# Patient Record
Sex: Female | Born: 1970
Health system: Southern US, Community
[De-identification: ages and names within clinical notes are randomized; demographics above are authoritative.]

## PROBLEM LIST (undated history)

## (undated) DIAGNOSIS — M170 Bilateral primary osteoarthritis of knee: Secondary | ICD-10-CM

## (undated) DIAGNOSIS — E781 Pure hyperglyceridemia: Secondary | ICD-10-CM

## (undated) DIAGNOSIS — R87619 Unspecified abnormal cytological findings in specimens from cervix uteri: Secondary | ICD-10-CM

## (undated) HISTORY — DX: Unspecified abnormal cytological findings in specimens from cervix uteri: R87.619

## (undated) HISTORY — PX: BREAST CYST ASPIRATION: SHX578

## (undated) HISTORY — DX: Bilateral primary osteoarthritis of knee: M17.0

## (undated) HISTORY — DX: Pure hyperglyceridemia: E78.1

---

## 2001-09-08 ENCOUNTER — Encounter: Payer: Self-pay | Admitting: Internal Medicine

## 2001-09-08 ENCOUNTER — Ambulatory Visit (HOSPITAL_COMMUNITY): Admission: RE | Admit: 2001-09-08 | Discharge: 2001-09-08 | Payer: Self-pay | Admitting: Internal Medicine

## 2003-01-03 ENCOUNTER — Ambulatory Visit (HOSPITAL_COMMUNITY): Admission: RE | Admit: 2003-01-03 | Discharge: 2003-01-03 | Payer: Self-pay

## 2003-12-08 ENCOUNTER — Other Ambulatory Visit: Admission: RE | Admit: 2003-12-08 | Discharge: 2003-12-08 | Payer: Self-pay | Admitting: Obstetrics and Gynecology

## 2003-12-09 ENCOUNTER — Other Ambulatory Visit: Admission: RE | Admit: 2003-12-09 | Discharge: 2003-12-09 | Payer: Self-pay | Admitting: Obstetrics and Gynecology

## 2003-12-20 ENCOUNTER — Encounter: Admission: RE | Admit: 2003-12-20 | Discharge: 2003-12-20 | Payer: Self-pay | Admitting: Obstetrics and Gynecology

## 2005-01-18 ENCOUNTER — Other Ambulatory Visit: Admission: RE | Admit: 2005-01-18 | Discharge: 2005-01-18 | Payer: Self-pay | Admitting: Obstetrics and Gynecology

## 2006-01-27 ENCOUNTER — Other Ambulatory Visit: Admission: RE | Admit: 2006-01-27 | Discharge: 2006-01-27 | Payer: Self-pay | Admitting: Obstetrics and Gynecology

## 2007-05-18 ENCOUNTER — Inpatient Hospital Stay (HOSPITAL_COMMUNITY): Admission: AD | Admit: 2007-05-18 | Discharge: 2007-05-18 | Payer: Self-pay | Admitting: Obstetrics and Gynecology

## 2007-11-26 HISTORY — PX: OTHER SURGICAL HISTORY: SHX169

## 2008-01-18 ENCOUNTER — Inpatient Hospital Stay (HOSPITAL_COMMUNITY): Admission: AD | Admit: 2008-01-18 | Discharge: 2008-01-20 | Payer: Self-pay | Admitting: Obstetrics and Gynecology

## 2008-03-03 ENCOUNTER — Ambulatory Visit: Payer: Self-pay | Admitting: Gastroenterology

## 2008-03-10 ENCOUNTER — Ambulatory Visit (HOSPITAL_COMMUNITY): Admission: RE | Admit: 2008-03-10 | Discharge: 2008-03-10 | Payer: Self-pay | Admitting: Gastroenterology

## 2008-03-10 ENCOUNTER — Ambulatory Visit: Payer: Self-pay | Admitting: Gastroenterology

## 2008-03-18 ENCOUNTER — Ambulatory Visit: Payer: Self-pay | Admitting: Internal Medicine

## 2008-06-28 DIAGNOSIS — K6289 Other specified diseases of anus and rectum: Secondary | ICD-10-CM | POA: Insufficient documentation

## 2008-06-28 DIAGNOSIS — S3140XA Unspecified open wound of vagina and vulva, initial encounter: Secondary | ICD-10-CM | POA: Insufficient documentation

## 2008-06-28 DIAGNOSIS — K649 Unspecified hemorrhoids: Secondary | ICD-10-CM | POA: Insufficient documentation

## 2008-06-28 DIAGNOSIS — Z8719 Personal history of other diseases of the digestive system: Secondary | ICD-10-CM | POA: Insufficient documentation

## 2008-08-02 ENCOUNTER — Ambulatory Visit (HOSPITAL_COMMUNITY): Admission: RE | Admit: 2008-08-02 | Discharge: 2008-08-03 | Payer: Self-pay | Admitting: Obstetrics and Gynecology

## 2011-04-09 NOTE — Op Note (Signed)
Renee Cordova, Renee Cordova                ACCOUNT NO.:  [id number]   MEDICAL RECORD NO.:  [id number]          PATIENT TYPE:  AMB   LOCATION:  DAY                           FACILITY:  [facility]   PHYSICIAN:  Kassie Mends, M.D.      DATE OF BIRTH:  Apr 16, 1971   DATE OF PROCEDURE:  03/10/2008  DATE OF DISCHARGE:  03/10/2008                               OPERATIVE REPORT   PROCEDURE:  Ileocolonoscopy.   REFERRING Alaney Witter:  Randye Lobo, MD   INDICATION FOR EXAM:  Ms. Diop is a 40 year old female who is 7-8  weeks postpartum and presented with rectal pain and intermittent rectal  bleeding.  She has a significant past history of a vaginal delivery with  a third degree laceration and right vaginal wall tear.  She has been  tried on different preparations for hemorrhoids and her pain and  discomfort persists. The colonoscopy is being performed to evaluate her  rectal bleeding.   FINDINGS:  1. Normal distal terminal ileum approximately 10 cm visualized.  2. Normal colon without evidence of polyps, mass, inflammatory      changes, diverticula or AVMs.  3. Small internal hemorrhoids, moderate external hemorrhoids.      Otherwise, normal retroflexed view of the rectum.  Unable to      appreciate anal fissure upon withdrawing the scope.   DIAGNOSIS:  I believe the rectal discomfort that Ms. Kaman is having is  most likely secondary to her episiotomy.  Some of her rectal discomfort  may be associated with internal hemorrhoids and external hemorrhoids.  Certainly, the rectal bleeding could be secondary to the hemorrhoids.   RECOMMENDATIONS:  1. She is asked to use Analpram-HC 2.5% four times a day for the next      21 days.  2. She is asked to soften her stool.  She states that when she took      Senokot and Colace, she had no control of her bowel movements when      they became softer.  She is a little hesitant to institute      aggressive therapy to soften her stools.  She is asked to take  Colace 100 mg 3 times a day and if that causes her stools to be too      loose, then she may titrate that dose.  She currently takes 1      daily.  3. I gave her a refill on the lidocaine 5%.  She may use it as      previously directed.  4. She should follow a low-residue diet.  She is given a handout on a      low-residue diet.  5. She has a follow up appointment to see me in 4 weeks to reassess      her rectal pain and bleeding.  If her symptoms are not improved,      then we will consider a referral to a tertiary care center for her      rectal discomfort.  She will be either referred to general surgery      or  obstetrics and gynecology.   PROCEDURE TECHNIQUE:  Physical exam was performed.  Informed consent was  obtained from the patient after explaining the benefits, risks, and  alternatives to the procedure.  The patient was connected to monitor and  placed in the left lateral position.  Continuous oxygen was provided by  nasal cannula.  IV medicine administered through an indwelling cannula.  After administration of sedation and rectal exam, the patient's rectum  was intubated.  Then, the  scope was advanced under direct visualization to the distal terminal  ileum.  The scope was removed slowly by carefully examining the color,  texture, anatomy, and integrity of the mucosa on the way out.  The  patient was recovered in endoscopy and discharged home in satisfactory  condition.      Kassie Mends, M.D.  Electronically Signed     SM/MEDQ  D:  03/11/2008  T:  03/12/2008  Job:  [id number]   cc:   Randye Lobo, M.D.  Fax: [phone]

## 2011-04-09 NOTE — Discharge Summary (Signed)
Renee Cordova, Renee Cordova                ACCOUNT NO.:  [id number]   MEDICAL RECORD NO.:  [id number]          PATIENT TYPE:  INP   LOCATION:  9127                          FACILITY:  [facility]   PHYSICIAN:  Gerrit Friends. Aldona Bar, M.D.   DATE OF BIRTH:  02/06/1971   DATE OF ADMISSION:  01/18/2008  DATE OF DISCHARGE:  01/20/2008                               DISCHARGE SUMMARY   DISCHARGE DIAGNOSES:  1. Term pregnancy delivered, 7 pounds 8 ounces female infant, Apgars 9      and 9.  2. Blood type AB positive.   PROCEDURES:  1. Low forceps delivery (Dr. Dareen Piano).  2. Third degree tear and repair.  3. Right vaginal wall tear and repair.   SUMMARY:  This 40 year old gravida 2, para 0 was admitted at term with  ruptured membranes.  Her pregnancy was relatively benign.  She  progressed and had a somewhat prolonged second stage of labor and was  ultimately delivered with low forceps by Dr. Dareen Piano over a third  degree tear and that plus a small vaginal wall tear on the right were  repaired without difficulty afterwards.  The patient was delivered of an  8 pounds 5 ounces female infant with good Apgars.  Postpartum course was  benign.  Discharge hemoglobin 8.7, white count 18,400, platelet count  200,000.  On the morning of February 25 the patient was stable, doing  well, was desirous of discharge.  Her breast-feeding was going well.   DISCHARGE MEDICATIONS:  1. Include vitamins - one a day.  2. Feosol capsules - one daily.  3. Motrin 600 mg every 6 hours as needed for cramping or pain.  4. She was given a prescription also for Tylox to use 1-2 every 4-6      hours as needed for more severe pain.  She was given a discharge      brochure at the time of discharge and understood all instructions      well.   CONDITION ON DISCHARGE:  Improved.   The patient will return to the office in approximately 4-5 weeks for  followup.      Gerrit Friends. Aldona Bar, M.D.  Electronically Signed     RMW/MEDQ  D:   01/20/2008  T:  01/20/2008  Job:  [id number]

## 2011-04-09 NOTE — Consult Note (Signed)
Renee Cordova, Renee Cordova                ACCOUNT NO.:  [id number]   MEDICAL RECORD NO.:  [id number]          PATIENT TYPE:  AMB   LOCATION:  DAY                           FACILITY:  [facility]   PHYSICIAN:  Kassie Mends, M.D.      DATE OF BIRTH:  1971-03-10   DATE OF CONSULTATION:  03/03/2008  DATE OF DISCHARGE:                                 CONSULTATION   REASON FOR CONSULTATION:  Severe proctalgia/hematochezia.   HISTORY OF PRESENT ILLNESS:  Renee Cordova is a 40 year old female who is 6  weeks postpartum.  She tells me since delivery she has been plagued with  hemorrhoids.  She has had severe proctalgia.  She has been tried on  Anucort as well as ProctoFoam.  She has been using sitz bath and  continues to complain of pain.  During her vaginal delivery, she  sustained a third-degree tear and tear into the right vaginal wall.  She  has been having severe pain at any time she tries to have a bowel  movement.  She has been using stool softeners so has had soft stools.  She has had to take ibuprofen about 400 mg once or twice or sometimes 3  times per day for pain.  She has noticed bright red blood on the toilet  paper with wiping.  Denies any history of melena.  She denies any  abdominal pain, nausea, vomiting, or  unusual weight loss other than due  to the pregnancy.  She is 139 pounds today.  Her pre-pregnancy weight  was 128 pounds.  She has been given a 7-day course of antibiotics which  she finished about 2 weeks ago for this problem from her OB/GYN.   PAST MEDICAL AND SURGICAL HISTORY:  Cordova teeth extraction.   CURRENT MEDICATIONS:  1. Ibuprofen 200 mg  to 400 mg b.i.d. to t.i.d.  2. Anucort 25 mg suppositories daily.   ALLERGIES:  No known drug allergies.   FAMILY HISTORY:  There is no known family history of carcinoma;  however, her father does have a history of colitis.  Mother has  arthritis and hypertension.  Her father also has coronary artery  disease.  She has 2 healthy  siblings.   SOCIAL HISTORY:  Renee Cordova is married.  She has a healthy 58-week-old  son.  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  She denies any tobacco or  drug use.  She occasionally consumes alcohol about twice a week.   REVIEW SYSTEMS:  See HPI, otherwise negative.   PHYSICAL EXAMINATION:  VITAL SIGNS:  Weight 139 pounds, height 62  inches, temp 97.9, blood pressure 110/78, and pulse 60.  GENERAL:  She is a well-developed, well-nourished Caucasian female in no  acute stress.HEENT:  Conjunctivae clear, nonicteric  sclerae.  Oropharynx is pink and moist.  She has 1+ tonsils.NECK:  Supple without  thyromegaly.CHEST:  Heart regular rate and rhythm.  Normal S1, S2  without murmurs, clicks, rubs, or gallop.LUNGS:  Clear to auscultation  bilaterally.  ABDOMEN:  Positive bowel sounds x4.  No bruit auscultated.  Soft and  nontender without  palpable mass or hepatosplenomegaly.  No rebound  tenderness or guarding.RECTAL:  She has small visible posterior  hemorrhoids which are not actively bleeding nor thrombosed.  She had  irregular margins where her previous sutures are.  There is an irregular  possible residual skin tag.  Internal exam, she is mildly tender, does  appear to have some internal edema without discrete mass.  She has some  formed stool which is light brown in the vault which is Hemoccult  negative.  EXTREMITIES:  Without clubbing or edema bilaterally.   IMPRESSION:  Renee Cordova is a 40 year old female.  She is 6 weeks  postpartum with vaginal delivery and had a third degree laceration as  well as right vaginal wall tear.  She has been having severe proctalgia  as well as intermittent hematochezia since delivery.  She has failed  multiple hemorrhoidal treatments.  On exam, she does have some small  hemorrhoids; however, her pain is more anteriorly at the site of her  sutures so she is going to require further evaluation.  There does not  appear evidence of perirectal abscess.   We could be dealing with a  fissure, hemorrhoids, or distal inflammatory bowel disease, but we do  have a need to rule out colorectal carcinoma as well, which is very  unlikely.   PLAN:  1. Stool softener.  She can continue stool softeners daily.  2. Continue sitz bath.  3. I have offered her pain medication in the way of Vicodin; however,      she has declined.  4. I have discussed this case with Dr. Kassie Mends and she is going to      proceed with flexible sigmoidoscopy in the near future.  She will      be prepped with HalfLytely bowel prep, and I have discussed the      risks and benefits of the procedure which include but are not      limited to bleeding, infection, perforation, and drug reaction.      She agrees to plan and consent will be obtained.   Thank you, Dr. Edward Jolly, for allowing [place] to participate in the care of Ms.  Cordova.      Lorenza Burton, N.P.      Kassie Mends, M.D.  Electronically Signed    KJ/MEDQ  D:  03/03/2008  T:  03/04/2008  Job:  [id number]   cc:   Randye Lobo, M.D.  Fax: [phone]

## 2011-04-09 NOTE — Assessment & Plan Note (Signed)
NAMEMarland Kitchen  MIN, TUNNELL                 CHART#:  [id number]   DATE:  03/18/2008                       DOB:  1971-03-02   CHIEF COMPLAINT:  Follow up severe proctalgia/questions about referral  to [facility].   SUBJECTIVE:  Ms. Spatz is a 40 year old female.  She has been evaluated  for severe proctalgia which began postpartum as well as intermittent  hematochezia.  She underwent a colonoscopy by Dr. Cira Servant on March 10, 2008.  She was found to have a normal distal terminal ileum  approximately 10 cm visualized, normal colon without evidence of polyps,  mass, inflammatory changes, diverticula or AVMs, small internal  hemorrhoids, moderate external hemorrhoids, and she was unable to  appreciate an anal fissure upon withdrawing the scope.  From the exam  with Dr. Cira Servant is impression that the rectal discomfort was most likely  secondary to her episiotomy, although she could have a component of  internal and external hemorrhoids and/or fissure not visualized on exam.  She has been set up an appointment with [facility] for further evaluation and appropriate treatment.  She tells me, in the meantime, she has been re-evaluated by Dr.  Dareen Piano.  She has been told that she has pelvic laxity and possible  cystocele and rectocele.  She continues to complain of feelings of  pelvic pressure and pressure with defecation since delivery.  She has  tried multiple hemorrhoidal treatments including more recently  lidocaine, Anucort, Anusol, none of which have helped.   CURRENT MEDICATIONS:  See the list from March 18, 2008.   ALLERGIES:  No known drug allergies.   PHYSICAL EXAM:  Was not performed today.   ASSESSMENT:  [name] is a 40 year old female with chronic proctalgia  status post vaginal delivery with third-degree laceration as well as  right vaginal wall tear status post repair.  Her intermittent  hematochezia has  decreased in frequency, however, she continues to  complain of chronic proctalgia.  She does have small internal, moderate  external hemorrhoids, however, I do not feel her significant proctalgia  can be explained by her hemorrhoidal disease.  It is possible she could  have an occult fissure, but our concerns are whether she has problems  from her tear and repair and possible cystocele and/or rectocele.  She  is going to require further evaluation at a tertiary care center.   PLAN:  1. She has an appointment March 23, 2008 with the OB/GYN clinic at      [facility].  2. She will request her medical records from her OB-GYN to be sent for      a second opinion.  3. Hemorrhoid literature given for her review.  4. She can continue hemorrhoidal treatments given previously by Dr.      Cira Servant including lidocaine and Adron Bene       Lorenza Burton, N.P.  Electronically Signed     R. Roetta Sessions, M.D.  Electronically Signed    KJ/MEDQ  D:  03/18/2008  T:  03/18/2008  Job:  [id number]   cc:   Dr. Edward Jolly in [place], [place]

## 2011-04-09 NOTE — Op Note (Signed)
Renee Cordova, Renee Cordova                ACCOUNT NO.:  [id number]   MEDICAL RECORD NO.:  [id number]          PATIENT TYPE:  OIB   LOCATION:  9305                          FACILITY:  [facility]   PHYSICIAN:  Randye Lobo, M.D.   DATE OF BIRTH:  04/28/1971   DATE OF PROCEDURE:  08/02/2008  DATE OF DISCHARGE:                               OPERATIVE REPORT   PREOPERATIVE DIAGNOSIS:  Symptomatic cystocele.   POSTOPERATIVE DIAGNOSIS:  Symptomatic cystocele.   PROCEDURE:  Anterior colporrhaphy with Minerva Fester plication,  cystoscopy.   SURGEON:  Randye Lobo, MD   ASSISTANT:  Luvenia Redden, MD   ANESTHESIA:  Spinal, IV sedation.   IV FLUIDS:  1750 mL.   URINE OUTPUT:  Quantity sufficient.   ESTIMATED BLOOD LOSS:  125 mL   COMPLICATIONS:  None.   INDICATIONS FOR PROCEDURE:  The patient is a 40 year old gravida 2, para  1-0-1-1 Caucasian female who presented with a vaginal bulge since  delivery of a viable female in February 2009.  The patient did have a  vaginal forceps delivery with a third-degree laceration and repair with  a delivery of an 8 pounds 5 ounces boy.  Following the delivery, the  patient had significant pelvic floor problems with perineal and perianal  pain, stress incontinence, and a vaginal protrusion, which precluded the  use of tampons.  The patient has gone through physical therapy and has  received vaginal estrogen treatments.  The patient's stress incontinence  has resolved.  She did undergo multichannel urodynamic testing, which  also confirmed the absence of stress incontinence.  The patient desires  repair now for her cystocele and she declines repair of a small  asymptomatic rectocele which she has.  The plan is now made to proceed  with an anterior colporrhaphy with Minerva Fester plication and possible  cystoscopy after risks, benefits, and alternatives are reviewed.   FINDINGS:  The patient is noted to have a second-degree cystocele and a  minimal  rectocele.  There is very minimal uterine prolapse appreciated.   Cystoscopy documents the bladder to be normal throughout 360 degrees  including the bladder dome and trigone.  There is no evidence of a  foreign body in the bladder and the urethra.  The ureters are noted be  to be patent bilaterally after the injection of indigo carmine dye IV.   SPECIMENS:  None.   PROCEDURE:  The patient was reidentified in the preoperative hold area.  She received both TED hose and PAS stockings for DVT prophylaxis.  The  patient received cefotetan 1 g IV for antibiotic prophylaxis.   In the operating room, a spinal anesthetic was administered and the  patient was then placed in the dorsal lithotomy position.  The patient  did receive IV sedation as well.  The vagina, perineum and lower abdomen  were then sterilely prepped and draped and a Foley catheter was placed  inside the bladder.   An examination under anesthesia was performed.  A weighted speculum was  placed inside the vagina.  Allis clamps were used to mark the midline of  the vaginal mucosa from the level of the cervix at 12 o'clock up to 1 cm  below the urethral meatus.  The vaginal mucosa was injected locally with  05% lidocaine with 1:200,000 of epinephrine.  The vaginal mucosa was  then incised in the midline with a scalpel.  A Metzenbaum scissors was  used to dissect vagina off of the subvaginal tissue and bladder.  The  dissection was carried back to the level of the pubic rami cranially and  caudally down to the level of the cervix.  Hemostasis was created with  monopolar cautery.   A Kelly Kennedy plication was placed along the mid urethra to the  urethrovesical junction by placing a vertical mattress type suture.  This was performed with 2-0 Vicryl.  This provided excellent elevation  of the urethrovesical junction.  The remainder of the anterior  colporrhaphy was performed with vertical mattress sutures of 2-0 Vicryl,  which  transitioned to 0 Vicryl for the final suture at the level of the  cervix.   The Foley catheter was removed and cystoscopy was performed after the  injection of indigo carmine dye IV.  The findings are as noted above.   A Foley catheter was then replaced and the vaginal mucosa was trimmed.  The anterior vaginal wall was closed with a running locked suture of 2-0  Vicryl.  A vaginal packing with Estrace cream was placed in the vagina.  The Foley catheter was left to gravity drainage.   The patient was cleansed of Betadine and escorted to the recovery room  in stable and awake condition.  There were no complications to the  procedure.  All needle, instrument, sponge counts were correct.      Randye Lobo, M.D.  Electronically Signed     BES/MEDQ  D:  08/02/2008  T:  08/02/2008  Job:  [id number]

## 2011-08-16 LAB — RPR: RPR Ser Ql: NONREACTIVE

## 2011-08-16 LAB — CBC
HCT: 37.8
Hemoglobin: 13.3
MCHC: 35.3
MCHC: 35.7
MCV: 91.2
Platelets: 200
Platelets: 232
RBC: 4.14
RDW: 13.2
RDW: 13.2
WBC: 11.6 — ABNORMAL HIGH

## 2011-08-28 LAB — URINALYSIS, ROUTINE W REFLEX MICROSCOPIC
Bilirubin Urine: NEGATIVE
Nitrite: NEGATIVE
Specific Gravity, Urine: <1.005 — ABNORMAL LOW
Urobilinogen, UA: 0.2
pH: 6.5

## 2011-08-28 LAB — CBC
HCT: 42.6
Hemoglobin: 14.4
MCHC: 34.6
RBC: 3.65 — ABNORMAL LOW
RDW: 12.5
RDW: 12.7
WBC: 9.2

## 2011-08-28 LAB — BASIC METABOLIC PANEL
Calcium: 10.5
GFR calc Af Amer: >60
GFR calc non Af Amer: >60
Glucose, Bld: 75
Potassium: 3.9
Sodium: 137

## 2011-08-28 LAB — URINE MICROSCOPIC-ADD ON

## 2011-08-28 LAB — PREGNANCY, URINE: Preg Test, Ur: NEGATIVE

## 2011-09-11 LAB — HCG, QUANTITATIVE, PREGNANCY: hCG, Beta Chain, Quant, S: 1031 — ABNORMAL HIGH

## 2013-03-15 ENCOUNTER — Encounter: Payer: Self-pay | Admitting: Family Medicine

## 2013-03-15 ENCOUNTER — Ambulatory Visit (INDEPENDENT_AMBULATORY_CARE_PROVIDER_SITE_OTHER): Payer: BC Managed Care – PPO | Admitting: Family Medicine

## 2013-03-15 VITALS — BP 106/80 | Temp 98.4°F | Ht 62.0 in | Wt 136.0 lb

## 2013-03-15 DIAGNOSIS — J029 Acute pharyngitis, unspecified: Secondary | ICD-10-CM

## 2013-03-15 DIAGNOSIS — J309 Allergic rhinitis, unspecified: Secondary | ICD-10-CM | POA: Insufficient documentation

## 2013-03-15 LAB — POCT RAPID STREP A (OFFICE): Rapid Strep A Screen: POSITIVE — AB

## 2013-03-15 MED ORDER — LORATADINE 10 MG PO TABS: 10.0000 mg | ORAL_TABLET | Freq: Every day | ORAL | Status: DC

## 2013-03-15 MED ORDER — CEFPROZIL 500 MG PO TABS: 500.0000 mg | ORAL_TABLET | Freq: Two times a day (BID) | ORAL | Status: DC

## 2013-03-15 NOTE — Patient Instructions (Signed)
Take all the antibiotics 

## 2013-03-15 NOTE — Progress Notes (Signed)
  Subjective:    Patient ID: Renee Cordova, female    DOB: Feb 21, 1971, 42 y.o.   MRN: [id number]  Sore Throat  This is a new problem. The current episode started in the past 7 days. The problem has been unchanged. There has been no fever. The fever has been present for less than 1 day. The pain is at a severity of 3/10. The pain is mild. Pertinent negatives include no coughing. She has had no exposure to strep. She has tried nothing for the symptoms. The treatment provided mild relief.    Results for orders placed in visit on 03/15/13  POCT RAPID STREP A (OFFICE)      Result Value Range   Rapid Strep A Screen Positive (*) Negative     Review of Systems  Respiratory: Negative for cough.    Some allergies. Review systems otherwise negative.    Objective:   Physical Exam  Alert vital signs stable. Lungs clear. Heart regular in rhythm. Pharynx erythematous. Tonsils somewhat enlarged.      Assessment & Plan:  Impression strep throat. Complicated somewhat by allergic rhinitis. Plan Cefzil 500 twice a day for 10 days. Claritin 10 mg daily #30 with refills. WSL

## 2013-03-17 ENCOUNTER — Telehealth: Payer: Self-pay | Admitting: Family Medicine

## 2013-03-17 MED ORDER — FLUCONAZOLE 150 MG PO TABS: 150.0000 mg | ORAL_TABLET | Freq: Once | ORAL | Status: DC

## 2013-03-17 NOTE — Telephone Encounter (Signed)
difl 150 2 one three days apart

## 2013-03-17 NOTE — Telephone Encounter (Signed)
RX sent into [facility]. Patient notified.

## 2013-03-17 NOTE — Telephone Encounter (Signed)
Pt is requesting some diflucan x2 for yeast infection due to taking the antibiotics.

## 2013-05-12 ENCOUNTER — Ambulatory Visit (INDEPENDENT_AMBULATORY_CARE_PROVIDER_SITE_OTHER): Payer: BC Managed Care – PPO | Admitting: Family Medicine

## 2013-05-12 ENCOUNTER — Encounter: Payer: Self-pay | Admitting: Family Medicine

## 2013-05-12 VITALS — BP 112/71 | Temp 98.1°F | Wt 135.8 lb

## 2013-05-12 DIAGNOSIS — J309 Allergic rhinitis, unspecified: Secondary | ICD-10-CM

## 2013-05-12 DIAGNOSIS — J020 Streptococcal pharyngitis: Secondary | ICD-10-CM

## 2013-05-12 LAB — POCT RAPID STREP A (OFFICE): Rapid Strep A Screen: NEGATIVE

## 2013-05-12 MED ORDER — FLUCONAZOLE 150 MG PO TABS: ORAL_TABLET | ORAL | Status: AC

## 2013-05-12 MED ORDER — AZITHROMYCIN 250 MG PO TABS: ORAL_TABLET | ORAL | Status: DC

## 2013-05-12 NOTE — Progress Notes (Signed)
  Subjective:    Patient ID: Joaquim Nam, female    DOB: [birth year], 42 y.o.   MRN: [id number]  Sore Throat  This is a new problem. The problem has been waxing and waning. Neither side of throat is experiencing more pain than the other. The maximum temperature recorded prior to her arrival was 100 - 100.9 F. The pain is at a severity of 4/10. The pain is moderate. Associated symptoms include headaches. Pertinent negatives include no ear pain. She has had no exposure to strep. She has tried acetaminophen for the symptoms. The treatment provided mild relief.  Headache  Pertinent negatives include no ear pain.   No tick bite. Diminished energy. Pos hx of sinus infxn   Review of Systems  HENT: Negative for ear pain.   Neurological: Positive for headaches.       Objective:   Physical Exam  Results for orders placed in visit on 05/12/13  STREP A DNA PROBE      Result Value Range   GASP POSITIVE    POCT RAPID STREP A (OFFICE)      Result Value Range   Rapid Strep A Screen Negative  Negative   Alert no acute distress. Lungs clear. Heart regular in rhythm. HEENT pharynx erythematous neck tender anterior nodes supple no frontal tenderness.      Assessment & Plan:  Impression 1 pharyngitis of note rapid strep screen negative. Plan addendum culture result positive for strep. Given Z-Pak. Symptomatic care discussed. Reasons for frequent infection discussed. WSL

## 2013-07-29 ENCOUNTER — Other Ambulatory Visit: Payer: Self-pay | Admitting: Obstetrics and Gynecology

## 2013-07-29 DIAGNOSIS — R928 Other abnormal and inconclusive findings on diagnostic imaging of breast: Secondary | ICD-10-CM

## 2013-08-06 ENCOUNTER — Ambulatory Visit
Admission: RE | Admit: 2013-08-06 | Discharge: 2013-08-06 | Disposition: A | Discharge status: Home / Self Care | Payer: BC Managed Care – PPO | Source: Ambulatory Visit | Attending: Obstetrics and Gynecology | Admitting: Obstetrics and Gynecology

## 2013-08-06 ENCOUNTER — Other Ambulatory Visit: Payer: Self-pay | Admitting: Obstetrics and Gynecology

## 2013-08-06 DIAGNOSIS — R928 Other abnormal and inconclusive findings on diagnostic imaging of breast: Secondary | ICD-10-CM

## 2013-08-07 ENCOUNTER — Other Ambulatory Visit: Payer: Self-pay | Admitting: Family Medicine

## 2013-08-20 ENCOUNTER — Ambulatory Visit
Admission: RE | Admit: 2013-08-20 | Discharge: 2013-08-20 | Disposition: A | Discharge status: Home / Self Care | Payer: BC Managed Care – PPO | Source: Ambulatory Visit | Attending: Obstetrics and Gynecology | Admitting: Obstetrics and Gynecology

## 2013-08-20 DIAGNOSIS — R928 Other abnormal and inconclusive findings on diagnostic imaging of breast: Secondary | ICD-10-CM

## 2013-10-28 ENCOUNTER — Ambulatory Visit (INDEPENDENT_AMBULATORY_CARE_PROVIDER_SITE_OTHER): Payer: BC Managed Care – PPO | Admitting: *Deleted

## 2013-10-28 DIAGNOSIS — Z23 Encounter for immunization: Secondary | ICD-10-CM

## 2014-02-25 ENCOUNTER — Ambulatory Visit (INDEPENDENT_AMBULATORY_CARE_PROVIDER_SITE_OTHER): Payer: BC Managed Care – PPO | Admitting: Nurse Practitioner

## 2014-02-25 ENCOUNTER — Encounter: Payer: Self-pay | Admitting: Nurse Practitioner

## 2014-02-25 VITALS — BP 112/62 | Temp 99.1°F | Ht 62.0 in | Wt 136.0 lb

## 2014-02-25 DIAGNOSIS — J069 Acute upper respiratory infection, unspecified: Secondary | ICD-10-CM

## 2014-02-25 DIAGNOSIS — J029 Acute pharyngitis, unspecified: Secondary | ICD-10-CM

## 2014-02-25 LAB — POCT RAPID STREP A (OFFICE): RAPID STREP A SCREEN: NEGATIVE

## 2014-02-25 MED ORDER — AZITHROMYCIN 250 MG PO TABS: ORAL_TABLET | ORAL | Status: DC

## 2014-02-26 LAB — STREP A DNA PROBE: GASP: POSITIVE

## 2014-03-01 ENCOUNTER — Encounter: Payer: Self-pay | Admitting: Nurse Practitioner

## 2014-03-01 NOTE — Progress Notes (Signed)
Subjective:  Presents complaints of sore throat and fever for the past 2 days. Slight fever. Slight headache. No cough runny nose or wheezing. No ear pain. No rash. Taking fluids well. Voiding normal limit.  Objective:   BP 112/62  Temp(Src) 99.1 F (37.3 C) (Oral)  Ht 5\' 2"  (1.575 m)  Wt 136 lb (61.689 kg)  BMI 24.87 kg/m2 NAD. Alert, oriented. TMs clear effusion, no erythema. Pharynx moderate erythema, no exudate or lesions noted. Some PND noted. Neck supple with mild soft anterior adenopathy. Lungs clear. Heart regular rate rhythm.  Assessment: Acute pharyngitis - Plan: POCT rapid strep A, Strep A DNA probe  Acute upper respiratory infections of unspecified site  Plan:  Meds ordered this encounter  Medications  . azithromycin (ZITHROMAX Z-PAK) 250 MG tablet    Sig: Take 2 tablets (500 mg) on  Day 1,  followed by 1 tablet (250 mg) once daily on Days 2 through 5.    Dispense:  6 each    Refill:  0    Order Specific Question:  Supervising Provider    Answer:  Merlyn AlbertLUKING, WILLIAM S [[id number]]   probable viral illness. Given prescription for Z-Pak in case it is needed over the weekend. Warning signs reviewed. Call back next week if no improvement, sooner if worse.

## 2014-05-18 ENCOUNTER — Ambulatory Visit (INDEPENDENT_AMBULATORY_CARE_PROVIDER_SITE_OTHER): Payer: BC Managed Care – PPO | Admitting: Family Medicine

## 2014-05-18 ENCOUNTER — Encounter: Payer: Self-pay | Admitting: Family Medicine

## 2014-05-18 VITALS — BP 108/82 | Temp 98.3°F | Ht 62.0 in | Wt 139.0 lb

## 2014-05-18 DIAGNOSIS — Z0189 Encounter for other specified special examinations: Secondary | ICD-10-CM

## 2014-05-18 DIAGNOSIS — I951 Orthostatic hypotension: Secondary | ICD-10-CM | POA: Insufficient documentation

## 2014-05-18 DIAGNOSIS — R5383 Other fatigue: Secondary | ICD-10-CM

## 2014-05-18 DIAGNOSIS — R5381 Other malaise: Secondary | ICD-10-CM

## 2014-05-18 NOTE — Progress Notes (Signed)
   Subjective:    Patient ID: Renee Cordova, female    DOB: 17-Mar-1971, 43 y.o.   MRN: [id number]  Dizziness This is a new problem. The current episode started in the past 7 days. The problem occurs intermittently. The problem has been unchanged. Associated symptoms include headaches. Nothing aggravates the symptoms. Treatments tried: decongestant. The treatment provided mild relief.   Patient states that she has no other concerns at this time.   Feels like lightheaded,  Going on for awhile, definitely seems to get worse in the heat.  Patient had been outdoors in the heat a lot felt lightheaded had only on the couch to arrive for few hours. Next  Not exercising regularly.  Headaches diffuse very mild in nature comes and goes generally bitemporal steady no photophobia no phonophobia no throbbing component.  On Monday office to the y, went to the [employer], got sweaty   Exercise not much at all    Review of Systems  Neurological: Positive for dizziness and headaches.   no chest pain no severe headaches no overt vertigo no nausea no vomiting no loss of consciousness ROS otherwise negative     Objective:   Physical Exam  Alert decent hydration lungs clear. Heart rare in rhythm. HEENT normal. Blood pressure low as 104/70. Ankles without edema. Discs sharp TMs normal neuro intact      Assessment & Plan:  Impression vasovagal type symptoms complicated by #2 #2 natural low blood pressure discussed at length. More prone to these spells. However long-term good news with lower mortality rate. #3 headaches mild tension in nature plan symptomatic care appropriate blood work hydration discussed regular exercise discussed avoidance measures discussed. WSL

## 2014-05-18 NOTE — Patient Instructions (Signed)
Lightheadedness is due to a combination of factors,

## 2014-05-30 LAB — CBC WITH DIFFERENTIAL/PLATELET
Basophils Absolute: 0.1 10*3/uL (ref 0.0–0.1)
Basophils Relative: 1 % (ref 0–1)
EOS ABS: 0.3 10*3/uL (ref 0.0–0.7)
EOS PCT: 4 % (ref 0–5)
HEMATOCRIT: 39.3 % (ref 36.0–46.0)
HEMOGLOBIN: 13.7 g/dL (ref 12.0–15.0)
LYMPHS ABS: 2.4 10*3/uL (ref 0.7–4.0)
LYMPHS PCT: 34 % (ref 12–46)
MCH: 30.4 pg (ref 26.0–34.0)
MCHC: 34.9 g/dL (ref 30.0–36.0)
MCV: 87.3 fL (ref 78.0–100.0)
MONOS PCT: 9 % (ref 3–12)
Monocytes Absolute: 0.6 10*3/uL (ref 0.1–1.0)
Neutro Abs: 3.7 10*3/uL (ref 1.7–7.7)
Neutrophils Relative %: 52 % (ref 43–77)
Platelets: 281 10*3/uL (ref 150–400)
RBC: 4.5 MIL/uL (ref 3.87–5.11)
RDW: 13.1 % (ref 11.5–15.5)
WBC: 7.1 10*3/uL (ref 4.0–10.5)

## 2014-05-30 LAB — LIPID PANEL
CHOLESTEROL: 184 mg/dL (ref 0–200)
HDL: 52 mg/dL (ref >39)
LDL Cholesterol: 85 mg/dL (ref 0–99)
TRIGLYCERIDES: 235 mg/dL — AB (ref <150)
Total CHOL/HDL Ratio: 3.5 Ratio
VLDL: 47 mg/dL — ABNORMAL HIGH (ref 0–40)

## 2014-05-30 LAB — TSH: TSH: 3.438 u[IU]/mL (ref 0.350–4.500)

## 2014-05-30 LAB — GLUCOSE, RANDOM: Glucose, Bld: 82 mg/dL (ref 70–99)

## 2014-09-01 ENCOUNTER — Telehealth: Payer: Self-pay | Admitting: Obstetrics and Gynecology

## 2014-09-01 NOTE — Telephone Encounter (Signed)
Call to confirm pts appt LMTCB

## 2014-09-02 ENCOUNTER — Ambulatory Visit (INDEPENDENT_AMBULATORY_CARE_PROVIDER_SITE_OTHER): Payer: BC Managed Care – PPO

## 2014-09-02 DIAGNOSIS — Z23 Encounter for immunization: Secondary | ICD-10-CM

## 2014-09-09 ENCOUNTER — Encounter: Payer: Self-pay | Admitting: Obstetrics and Gynecology

## 2014-09-09 ENCOUNTER — Other Ambulatory Visit: Payer: Self-pay | Admitting: Family Medicine

## 2014-09-09 ENCOUNTER — Ambulatory Visit (INDEPENDENT_AMBULATORY_CARE_PROVIDER_SITE_OTHER): Payer: BC Managed Care – PPO | Admitting: Obstetrics and Gynecology

## 2014-09-09 VITALS — BP 102/76 | HR 76 | Resp 16 | Ht 62.5 in | Wt 139.0 lb

## 2014-09-09 DIAGNOSIS — R3129 Other microscopic hematuria: Secondary | ICD-10-CM

## 2014-09-09 DIAGNOSIS — Z0000 Encounter for general adult medical examination without abnormal findings: Secondary | ICD-10-CM

## 2014-09-09 DIAGNOSIS — R312 Other microscopic hematuria: Secondary | ICD-10-CM

## 2014-09-09 DIAGNOSIS — Z23 Encounter for immunization: Secondary | ICD-10-CM

## 2014-09-09 DIAGNOSIS — Z01419 Encounter for gynecological examination (general) (routine) without abnormal findings: Secondary | ICD-10-CM

## 2014-09-09 LAB — POCT URINALYSIS DIPSTICK
BILIRUBIN UA: NEGATIVE
GLUCOSE UA: NEGATIVE
Ketones, UA: NEGATIVE
Leukocytes, UA: NEGATIVE
NITRITE UA: NEGATIVE
Protein, UA: NEGATIVE
Urobilinogen, UA: NEGATIVE
pH, UA: 5

## 2014-09-09 NOTE — Progress Notes (Signed)
43 y.o. No obstetric history on file. MarriedCaucasianF here for annual exam.    Left breast cysts.  Status [facility].   Regular menses - last 3 -4 days.   Good bladder control.  No urgency or accidents.  No prolapse.  No dyspareunia.   Urine - large blood today.   Has a history of hematuria.  Remembers seeing Dr. Patsi Searsannenbaum and had normal evaluation.   Patient's last menstrual period was 08/20/2014.          Sexually active: Yes.    The current method of family planning is vasectomy.    Exercising: No.  The patient does not participate in regular exercise at present. Smoker:  no  Health Maintenance: Pap: 05/2013 Normal History of abnormal Pap:  yes MMG: 08/06/13 BIRADS2:Benign, US 08/06/13 BIRADS2:Benign, 08/20/13 US Aspiration left. Colonoscopy:  2009 Normal BMD:   Never TDaP: ? Screening Labs:  Hb today: 13.7- 05/2014 PCP, Urine today:     reports that she has never smoked. She has never used smokeless tobacco. She reports that she drinks about 4.2 ounces of alcohol per week. She reports that she does not use illicit drugs.  Past Medical History  Diagnosis Date  . Abnormal Pap smear of cervix     History reviewed. No pertinent past surgical history. Cystocele repair 2009   Current Outpatient Prescriptions  Medication Sig Dispense Refill  . Calcium Carbonate-Vitamin D (CALCIUM + D PO) Take by mouth daily.      Marland Kitchen. loratadine (CLARITIN) 10 MG tablet TAKE ONE (1) TABLET EACH DAY  30 tablet  5  . Multiple Vitamin (MULTIVITAMIN) tablet Take 1 tablet by mouth daily.      . Omega-3 Fatty Acids (FISH OIL PO) Take by mouth.       No current facility-administered medications for this visit.    Family History  Problem Relation Age of Onset  . Hypertension Mother   . Arthritis Mother   . Anxiety disorder Mother   . Heart disease Father   . Colitis Father   . Breast cancer Paternal Aunt   . Cancer Maternal Grandmother     Ovarian    ROS:  Pertinent  items are noted in HPI.  Otherwise, a comprehensive ROS was negative.  Exam:   BP 102/76  Pulse 76  Resp 16  Ht 5' 2.5" (1.588 m)  Wt 139 lb (63.05 kg)  BMI 25.00 kg/m2  LMP 08/20/2014    Height: 5' 2.5" (158.8 cm)  Ht Readings from Last 3 Encounters:  09/09/14 5' 2.5" (1.588 m)  05/18/14 5\' 2"  (1.575 m)  02/25/14 5\' 2"  (1.575 m)    General appearance: alert, cooperative and appears stated age Head: Normocephalic, without obvious abnormality, atraumatic Neck: no adenopathy, supple, symmetrical, trachea midline and thyroid normal to inspection and palpation Lungs: clear to auscultation bilaterally Breasts: normal appearance, no masses or tenderness, Inspection negative, No nipple retraction or dimpling, No nipple discharge or bleeding, No axillary or supraclavicular adenopathy Heart: regular rate and rhythm Abdomen: soft, non-tender; bowel sounds normal; no masses,  no organomegaly Extremities: extremities normal, atraumatic, no cyanosis or edema Skin: Skin color, texture, turgor normal. No rashes or lesions Lymph nodes: Cervical, supraclavicular, and axillary nodes normal. No abnormal inguinal nodes palpated Neurologic: Grossly normal   Pelvic: External genitalia:  no lesions              Urethra:  normal appearing urethra with no masses, tenderness or lesions  Bartholins and Skenes: normal                 Vagina: normal appearing vagina with normal color and discharge, no lesions              Cervix: anteverted              Pap taken: Yes.   Bimanual Exam:  Uterus:  normal size, contour, position, consistency, mobility, non-tender              Adnexa: normal adnexa and no mass, fullness, tenderness               Rectovaginal: Confirms               Anus:  normal sphincter tone, no lesions  A:  Well Woman with normal exam Microscopic hematuria.  States negative work up.   P:   [facility]  Patient will call for 3D appointment.  pap smear and HR  HPV testing.  TDap.  Urine micro and culture.  Will need to determine if needs to return to urology.  Get old records.   return annually or prn  An After Visit Summary was printed and given to the patient.

## 2014-09-09 NOTE — Patient Instructions (Signed)

## 2014-09-10 LAB — URINALYSIS, MICROSCOPIC ONLY
BACTERIA UA: NONE SEEN
CASTS: NONE SEEN
CRYSTALS: NONE SEEN
SQUAMOUS EPITHELIAL / LPF: NONE SEEN

## 2014-09-11 LAB — URINE CULTURE
Colony Count: NO GROWTH
Organism ID, Bacteria: NO GROWTH

## 2014-09-12 ENCOUNTER — Telehealth: Payer: Self-pay

## 2014-09-12 NOTE — Telephone Encounter (Signed)
Called patient at [phone] LMOVM to call me.

## 2014-09-12 NOTE — Addendum Note (Signed)
Addended by: Annamaria HellingAMUNDSON DE CARVALHO E SILVA, Kadelyn Dimascio E on: 09/12/2014 10:31 AM   Modules accepted: Orders

## 2014-09-12 NOTE — Telephone Encounter (Signed)
Message copied by Alphonsa OverallIXON, Justan Gaede L on Mon Sep 12, 2014  9:18 AM ------      Message from: [facility] LtdMUNDSON DE Gwenevere GhaziARVALHO E SILVA, BROOK E      Created: Mon Sep 12, 2014  7:17 AM       Please inform patient of negative urine micro and culture.       I did get her records and she has a history of a urethral polyp noted by Dr. Patsi Searsannenbaum.      As the microscopic exam was negative, no further evaluation is needed at this time.        ------

## 2014-09-13 LAB — IPS PAP TEST WITH HPV

## 2014-09-13 NOTE — Telephone Encounter (Signed)
Patient notified see result note 

## 2014-09-14 ENCOUNTER — Telehealth: Payer: Self-pay | Admitting: Obstetrics and Gynecology

## 2014-09-14 NOTE — Telephone Encounter (Signed)
Pt has records to pick up

## 2014-09-26 ENCOUNTER — Encounter: Payer: Self-pay | Admitting: Obstetrics and Gynecology

## 2015-06-13 ENCOUNTER — Encounter: Payer: Self-pay | Admitting: Nurse Practitioner

## 2015-06-13 ENCOUNTER — Ambulatory Visit (INDEPENDENT_AMBULATORY_CARE_PROVIDER_SITE_OTHER): Payer: BLUE CROSS/BLUE SHIELD | Admitting: Nurse Practitioner

## 2015-06-13 VITALS — BP 118/76 | Ht 62.0 in | Wt 146.0 lb

## 2015-06-13 DIAGNOSIS — K130 Diseases of lips: Secondary | ICD-10-CM

## 2015-06-13 MED ORDER — NYSTATIN 100000 UNIT/GM EX CREA: 1.0000 "application " | TOPICAL_CREAM | Freq: Two times a day (BID) | CUTANEOUS | Status: DC

## 2015-06-13 NOTE — Patient Instructions (Signed)
Hydrocortisone 1% cream

## 2015-06-16 ENCOUNTER — Encounter: Payer: Self-pay | Admitting: Nurse Practitioner

## 2015-06-16 NOTE — Progress Notes (Signed)
Subjective:  Presents for complaints of a dry spot on her upper right lip for the past 3-4 weeks. No relief with lip balm. Nontender. Mildly pruritic. Slightly numb in the area. No oral lesions. No fever. No other rash.  Objective:   BP 118/76 mmHg  Ht  (1.575 m)  Wt 146 lb (66.225 kg)  BMI 26.70 kg/m2 NAD. Alert, oriented. Very faint pink slightly raised area noted on the margin of the right upper lip area. No cyst or mass noted. Nontender to palpation.  Assessment: Lesion of lip  Plan:  Meds ordered this encounter  Medications  . nystatin cream (MYCOSTATIN)    Sig: Apply 1 application topically 2 (two) times daily. Prn    Dispense:  30 g    Refill:  0    Order Specific Question:  Supervising Provider    Answer:  Merlyn Albert [[id number]]   Apply hydrocortisone 1% cream mixed with nystatin cream twice a day to area up to 2 weeks. Patient to call back at that time if lesion persist, recommend referral to dermatology at that point.

## 2015-09-13 ENCOUNTER — Ambulatory Visit (INDEPENDENT_AMBULATORY_CARE_PROVIDER_SITE_OTHER): Payer: BLUE CROSS/BLUE SHIELD | Admitting: Obstetrics and Gynecology

## 2015-09-13 ENCOUNTER — Encounter: Payer: Self-pay | Admitting: Obstetrics and Gynecology

## 2015-09-13 VITALS — BP 120/78 | HR 70 | Resp 16 | Ht 62.0 in | Wt 145.0 lb

## 2015-09-13 DIAGNOSIS — R3129 Other microscopic hematuria: Secondary | ICD-10-CM

## 2015-09-13 DIAGNOSIS — Z01419 Encounter for gynecological examination (general) (routine) without abnormal findings: Secondary | ICD-10-CM

## 2015-09-13 DIAGNOSIS — Z0000 Encounter for general adult medical examination without abnormal findings: Secondary | ICD-10-CM

## 2015-09-13 LAB — POCT URINALYSIS DIPSTICK
Bilirubin, UA: NEGATIVE
GLUCOSE UA: NEGATIVE
Ketones, UA: NEGATIVE
LEUKOCYTES UA: NEGATIVE
NITRITE UA: NEGATIVE
Protein, UA: NEGATIVE
Urobilinogen, UA: NEGATIVE
pH, UA: 5

## 2015-09-13 NOTE — Progress Notes (Signed)
44 y.o. 532P1011 Married Caucasian female here for annual exam.    Menses controlled with ibuprofen for cramps.  Regular menses.   Has good bladder control.  Normal bowel function.   Has had urology evaluation by Dr. Patsi Searsannenbaum for hematuria.  Had a bladder polyp removed.   Son is 227.44 years old and in second grade.   PCP:  Francina AmesW.Steve Luking, MD   Patient's last menstrual period was 09/03/2015 (exact date).          Sexually active: Yes.  husband  The current method of family planning is vasectomy.    Exercising: Yes.    walking. Smoker:  no  Health Maintenance: Pap:  09/09/14 Neg. HR HPV:neg History of abnormal Pap:  Yes, Had colposcopy in her early 6220s but doesn't think she had any treatment to cervix. MMG:  08/06/13 BIRADS2:Benign. Successful ultrasound-guided aspiration of a subareolar left breast cyst. No apparent complications.  Colonoscopy:  2009 Normal  BMD:   Never TDaP:  2015 Screening Labs:  Hb today: PCP, Urine today: Mod. RBCs--Urologic work-up showing polyp in past.   reports that she has never smoked. She has never used smokeless tobacco. She reports that she drinks about 3.0 oz of alcohol per week. She reports that she does not use illicit drugs.  Past Medical History  Diagnosis Date  . Abnormal Pap smear of cervix     Past Surgical History  Procedure Laterality Date  . Cystoclel repair  2009    Current Outpatient Prescriptions  Medication Sig Dispense Refill  . Calcium Carbonate-Vitamin D (CALCIUM + D PO) Take by mouth daily.    Marland Kitchen. loratadine (CLARITIN) 10 MG tablet TAKE ONE (1) TABLET EACH DAY (Patient taking differently: TAKE ONE (1) TABLET prn) 30 tablet 5  . Multiple Vitamin (MULTIVITAMIN) tablet Take 1 tablet by mouth daily.    . Omega-3 Fatty Acids (FISH OIL PO) Take by mouth.     No current facility-administered medications for this visit.    Family History  Problem Relation Age of Onset  . Hypertension Mother   . Arthritis Mother   . Anxiety  disorder Mother   . Heart disease Father   . Colitis Father   . Breast cancer Paternal Aunt   . Cancer Maternal Grandmother     Ovarian    ROS:  Pertinent items are noted in HPI.  Otherwise, a comprehensive ROS was negative.  Exam:   BP 120/78 mmHg  Pulse 70  Resp 16  Ht 5\' 2"  (1.575 m)  Wt 145 lb (65.772 kg)  BMI 26.51 kg/m2  LMP 09/03/2015 (Exact Date)    General appearance: alert, cooperative and appears stated age Head: Normocephalic, without obvious abnormality, atraumatic Neck: no adenopathy, supple, symmetrical, trachea midline and thyroid normal to inspection and palpation Lungs: clear to auscultation bilaterally Breasts: normal appearance, no masses or tenderness, Inspection negative, No nipple retraction or dimpling, No nipple discharge or bleeding, No axillary or supraclavicular adenopathy Heart: regular rate and rhythm Abdomen: soft, non-tender; bowel sounds normal; no masses,  no organomegaly Extremities: extremities normal, atraumatic, no cyanosis or edema Skin: Skin color, texture, turgor normal. No rashes or lesions Lymph nodes: Cervical, supraclavicular, and axillary nodes normal. No abnormal inguinal nodes palpated Neurologic: Grossly normal  Pelvic: External genitalia:  no lesions              Urethra:  normal appearing urethra with no masses, tenderness or lesions  Bartholins and Skenes: normal                 Vagina: normal appearing vagina with normal color and discharge, no lesions              Cervix: no lesions              Pap taken: No. Bimanual Exam:  Uterus:  normal size, contour, position, consistency, mobility, non-tender              Adnexa: normal adnexa and no mass, fullness, tenderness              Rectovaginal: Yes.  .  Confirms.              Anus:  normal sphincter tone, no lesions  Chaperone was present for exam.  Assessment:   Well woman visit with normal exam. Remote hx of abnormal paps.  Hx breast cysts.  Microscopic  hematuria.  Plan: Yearly mammogram recommended after age 89.  Patient will schedule at [facility]. Recommended self breast exam.  Pap and HR HPV as above. Discussed Calcium, Vitamin D, regular exercise program including cardiovascular and weight bearing exercise. Labs performed.   Yes.  Urine micro and culture. Refills given on medications.  No..  See orders. Follow up annually and prn.      After visit summary provided.

## 2015-09-13 NOTE — Patient Instructions (Signed)

## 2015-09-14 LAB — URINALYSIS, MICROSCOPIC ONLY
BACTERIA UA: NONE SEEN [HPF]
Casts: NONE SEEN [LPF]
Crystals: NONE SEEN [HPF]
RBC / HPF: NONE SEEN RBC/HPF (ref <=2)
SQUAMOUS EPITHELIAL / LPF: NONE SEEN [HPF] (ref <=5)
WBC UA: NONE SEEN WBC/HPF (ref <=5)
Yeast: NONE SEEN [HPF]

## 2015-09-15 ENCOUNTER — Telehealth: Payer: Self-pay

## 2015-09-15 LAB — URINE CULTURE
Colony Count: NO GROWTH
ORGANISM ID, BACTERIA: NO GROWTH

## 2015-09-15 NOTE — Telephone Encounter (Signed)
Spoke with patient. Advised of results as seen below from Dr.Silva. Patient is agreeable and verbalizes understanding.  Routing to provider for final review. Patient agreeable to disposition. Will close encounter.     

## 2015-09-15 NOTE — Telephone Encounter (Signed)
-----   Message from Patton SallesBrook E Amundson C Silva, MD sent at 09/15/2015  5:13 AM EDT ----- Please inform patient of urine micro which was completely normal and her urine culture which was negative.  Her positive urine dip in the office was a false positive.  Nothing further needs to be done.

## 2015-09-15 NOTE — Telephone Encounter (Signed)
Left message to call Kaitlyn at [phone]. 

## 2015-09-15 NOTE — Telephone Encounter (Signed)
Patient returned your call during lunchtime, left voicemail msg. Best contact [phone]

## 2015-10-06 ENCOUNTER — Ambulatory Visit (INDEPENDENT_AMBULATORY_CARE_PROVIDER_SITE_OTHER): Payer: BLUE CROSS/BLUE SHIELD

## 2015-10-06 DIAGNOSIS — Z23 Encounter for immunization: Secondary | ICD-10-CM | POA: Diagnosis not present

## 2016-02-27 ENCOUNTER — Other Ambulatory Visit: Payer: Self-pay

## 2016-02-27 DIAGNOSIS — Z1231 Encounter for screening mammogram for malignant neoplasm of breast: Secondary | ICD-10-CM

## 2016-03-14 ENCOUNTER — Encounter: Payer: Self-pay | Admitting: Family Medicine

## 2016-03-14 ENCOUNTER — Ambulatory Visit (INDEPENDENT_AMBULATORY_CARE_PROVIDER_SITE_OTHER): Payer: BLUE CROSS/BLUE SHIELD | Admitting: Nurse Practitioner

## 2016-03-14 ENCOUNTER — Encounter: Payer: Self-pay | Admitting: Nurse Practitioner

## 2016-03-14 ENCOUNTER — Ambulatory Visit
Admission: RE | Admit: 2016-03-14 | Discharge: 2016-03-14 | Disposition: A | Discharge status: Home / Self Care | Payer: BLUE CROSS/BLUE SHIELD | Source: Ambulatory Visit

## 2016-03-14 VITALS — BP 108/78 | Ht 62.0 in | Wt 146.4 lb

## 2016-03-14 DIAGNOSIS — Z1231 Encounter for screening mammogram for malignant neoplasm of breast: Secondary | ICD-10-CM

## 2016-03-14 DIAGNOSIS — N6081 Other benign mammary dysplasias of right breast: Secondary | ICD-10-CM | POA: Diagnosis not present

## 2016-03-15 ENCOUNTER — Encounter: Payer: Self-pay | Admitting: Nurse Practitioner

## 2016-03-15 NOTE — Progress Notes (Signed)
Subjective:  Presents for complaints of a small bump in the right breast that is been there for several weeks. Off-and-on in size. Occasionally tender. Occasionally erythematous. No drainage is been noted. Had her mammogram done earlier, according to patient they marked this area. No fever.  Objective:   BP 108/78 mmHg  Ht 5\' 2"  (1.575 m)  Wt 146 lb 6 oz (66.395 kg)  BMI 26.77 kg/m2 NAD. Alert, oriented. Well circumscribed circular pink minimally raised lesion noted on the right breast between 11 and 12:00 just outside of the areola. Superficial. Nontender. No drainage. Approximately 3-4 millimeters in size.  Assessment: Sebaceous cyst of breast, right  Plan: Appearance of a cystic lesion most likely sebaceous cyst. Her husband has been laid off his job, will be losing her insurance on 4/30. Patient referred to dermatology next week for evaluation of lesion.

## 2016-03-19 DIAGNOSIS — Z1283 Encounter for screening for malignant neoplasm of skin: Secondary | ICD-10-CM | POA: Diagnosis not present

## 2016-03-19 DIAGNOSIS — D225 Melanocytic nevi of trunk: Secondary | ICD-10-CM | POA: Diagnosis not present

## 2016-03-19 DIAGNOSIS — L723 Sebaceous cyst: Secondary | ICD-10-CM | POA: Diagnosis not present

## 2016-10-02 DIAGNOSIS — M9901 Segmental and somatic dysfunction of cervical region: Secondary | ICD-10-CM | POA: Diagnosis not present

## 2016-10-02 DIAGNOSIS — M542 Cervicalgia: Secondary | ICD-10-CM | POA: Diagnosis not present

## 2016-10-02 DIAGNOSIS — M546 Pain in thoracic spine: Secondary | ICD-10-CM | POA: Diagnosis not present

## 2016-10-02 DIAGNOSIS — M9902 Segmental and somatic dysfunction of thoracic region: Secondary | ICD-10-CM | POA: Diagnosis not present

## 2016-10-03 NOTE — Progress Notes (Signed)
45 y.o. 312P1011 Married Caucasian female here for annual exam.    Menses are regular and short but more cramping.  Some increased heat at night.   Muscle and joint pain.  Seeing a [facility].   [place]   PCP:  Donna BernardW.Stephen Luking, MD   No LMP recorded.     Period Cycle (Days): 30 Period Duration (Days): 2-3 days Period Pattern: Regular Menstrual Flow: Moderate Menstrual Control: Tampon Menstrual Control Change Freq (Hours): every 4 hours on heaviest day Dysmenorrhea: (!) Moderate Dysmenorrhea Symptoms: Cramping     Sexually active: Yes.   female The current method of family planning is vasectomy.    Exercising: Yes.    Walks 1-2x/week Smoker:  no  Health Maintenance: Pap:  09-09-14 Neg:Neg HR HPV History of abnormal Pap:  Yes, Had colposcopy in her early 2920s but doesn't think she had any treatment to cervix. MMG:  03-14-16 Density C/Neg/BiRads1:[facility] Colonoscopy:  2009 normal BMD:   n/a  Result  n/a TDaP:  2015 Gardasil:   N/A HIV: neg in pregnancy. Screening Labs:   Urine today: 2+RBCs--is spotting    reports that she has never smoked. She has never used smokeless tobacco. She reports that she drinks about 3.0 oz of alcohol per week . She reports that she does not use drugs.  Past Medical History:  Diagnosis Date  . Abnormal Pap smear of cervix     Past Surgical History:  Procedure Laterality Date  . cystoclel repair  2009    Current Outpatient Prescriptions  Medication Sig Dispense Refill  . Calcium Carbonate-Vitamin D (CALCIUM + D PO) Take by mouth daily.    . Multiple Vitamin (MULTIVITAMIN) tablet Take 1 tablet by mouth daily.    . Omega-3 Fatty Acids (FISH OIL PO) Take by mouth.     No current facility-administered medications for this visit.     Family History  Problem Relation Age of Onset  . Hypertension Mother   . Arthritis Mother   . Anxiety disorder Mother   . Heart disease Father   . Colitis Father   .  Breast cancer Paternal Aunt   . Cancer Maternal Grandmother     Ovarian    ROS:  Pertinent items are noted in HPI.  Otherwise, a comprehensive ROS was negative.  Exam:   There were no vitals taken for this visit.    General appearance: alert, cooperative and appears stated age Head: Normocephalic, without obvious abnormality, atraumatic Neck: no adenopathy, supple, symmetrical, trachea midline and thyroid normal to inspection and palpation Lungs: clear to auscultation bilaterally Breasts: normal appearance, no masses or tenderness, No nipple retraction or dimpling, No nipple discharge or bleeding, No axillary or supraclavicular adenopathy Heart: regular rate and rhythm Abdomen: soft, non-tender; no masses, no organomegaly Extremities: extremities normal, atraumatic, no cyanosis or edema Skin: Skin color, texture, turgor normal. No rashes or lesions Lymph nodes: Cervical, supraclavicular, and axillary nodes normal. No abnormal inguinal nodes palpated Neurologic: Grossly normal  Pelvic: External genitalia:  no lesions              Urethra:  normal appearing urethra with no masses, tenderness or lesions              Bartholins and Skenes: normal                 Vagina: normal appearing vagina with normal color and discharge, no lesions  Cervix: no lesions.  Menstrual blood.              Pap taken: No. Bimanual Exam:  Uterus:  normal size, contour, position, consistency, mobility, non-tender              Adnexa: no mass, fullness, tenderness              Rectal exam: Yes.  .  Confirms.              Anus:  normal sphincter tone, no lesions  Chaperone was present for exam.  Assessment:   Well woman visit with normal exam. FH ovarian cancer.   Plan: Mammogram guidelines reviewed.  Discussed 3D mammogram. Recommended self breast exam.  Pap and HR HPV as above.  Do next year. Guidelines for Calcium, Vitamin D, regular exercise program including cardiovascular and weight  bearing exercise. Routine labs. Follow up annually and prn.      After visit summary provided.

## 2016-10-04 ENCOUNTER — Encounter: Payer: Self-pay | Admitting: Obstetrics and Gynecology

## 2016-10-04 ENCOUNTER — Ambulatory Visit (INDEPENDENT_AMBULATORY_CARE_PROVIDER_SITE_OTHER): Payer: BLUE CROSS/BLUE SHIELD | Admitting: Obstetrics and Gynecology

## 2016-10-04 DIAGNOSIS — Z01419 Encounter for gynecological examination (general) (routine) without abnormal findings: Secondary | ICD-10-CM | POA: Diagnosis not present

## 2016-10-04 DIAGNOSIS — Z0000 Encounter for general adult medical examination without abnormal findings: Secondary | ICD-10-CM

## 2016-10-04 LAB — POCT URINALYSIS DIPSTICK
Bilirubin, UA: NEGATIVE
Glucose, UA: NEGATIVE
Ketones, UA: NEGATIVE
Leukocytes, UA: NEGATIVE
NITRITE UA: NEGATIVE
PH UA: 5
PROTEIN UA: NEGATIVE
UROBILINOGEN UA: NEGATIVE

## 2016-10-04 LAB — COMPREHENSIVE METABOLIC PANEL
ALT: 9 U/L (ref 6–29)
AST: 15 U/L (ref 10–35)
Albumin: 4.2 g/dL (ref 3.6–5.1)
Alkaline Phosphatase: 40 U/L (ref 33–115)
BUN: 11 mg/dL (ref 7–25)
CHLORIDE: 105 mmol/L (ref 98–110)
CO2: 20 mmol/L (ref 20–31)
CREATININE: 0.62 mg/dL (ref 0.50–1.10)
Calcium: 10 mg/dL (ref 8.6–10.2)
GLUCOSE: 81 mg/dL (ref 65–99)
Potassium: 4.3 mmol/L (ref 3.5–5.3)
SODIUM: 141 mmol/L (ref 135–146)
TOTAL PROTEIN: 7 g/dL (ref 6.1–8.1)
Total Bilirubin: 0.5 mg/dL (ref 0.2–1.2)

## 2016-10-04 LAB — LIPID PANEL
CHOLESTEROL: 189 mg/dL (ref <200)
HDL: 50 mg/dL — AB (ref >50)
LDL Cholesterol: 98 mg/dL (ref <100)
Total CHOL/HDL Ratio: 3.8 Ratio (ref <5.0)
Triglycerides: 205 mg/dL — ABNORMAL HIGH (ref <150)
VLDL: 41 mg/dL — ABNORMAL HIGH (ref <30)

## 2016-10-04 LAB — CBC
HCT: 40.2 % (ref 35.0–45.0)
Hemoglobin: 13.5 g/dL (ref 11.7–15.5)
MCH: 30.6 pg (ref 27.0–33.0)
MCHC: 33.6 g/dL (ref 32.0–36.0)
MCV: 91.2 fL (ref 80.0–100.0)
MPV: 9.1 fL (ref 7.5–12.5)
PLATELETS: 328 10*3/uL (ref 140–400)
RBC: 4.41 MIL/uL (ref 3.80–5.10)
RDW: 13 % (ref 11.0–15.0)
WBC: 7.6 10*3/uL (ref 3.8–10.8)

## 2016-10-04 LAB — TSH: TSH: 2.85 mIU/L

## 2016-10-04 NOTE — Patient Instructions (Signed)

## 2016-10-05 ENCOUNTER — Encounter: Payer: Self-pay | Admitting: Obstetrics and Gynecology

## 2016-10-05 LAB — VITAMIN D 25 HYDROXY (VIT D DEFICIENCY, FRACTURES): Vit D, 25-Hydroxy: 34 ng/mL (ref 30–100)

## 2016-10-07 ENCOUNTER — Telehealth: Payer: Self-pay

## 2016-10-07 NOTE — Telephone Encounter (Signed)
Called patient at [phone] to discuss lab results. Per DPR, left patient a detailed message as stated below from Dr.Silva. Advised patient to follow up with PCP regarding abnormal Triglycerides and Low HDL and advised she can call us if she has any questions. She can request a copy of her labs to take to Dr.Luking.

## 2016-10-07 NOTE — Telephone Encounter (Signed)
-----   Message from Patton SallesBrook E Amundson C Silva, MD sent at 10/05/2016  6:22 AM EST ----- Please report labs to patient.  Normal vit D, thyroid, blood chemistries.  Her cholesterol panel is showing elevated triglycerides and low good cholesterol, HDL.  I am recommending decreasing saturated fats and cholesterol in her diet and increasing vigorous physical activity.   I am also recommending follow up with her PCP.  These numbers look similar to 2 years ago.   Cc- Claudette LawsAmanda Kamdin Follett

## 2016-10-09 DIAGNOSIS — M9902 Segmental and somatic dysfunction of thoracic region: Secondary | ICD-10-CM | POA: Diagnosis not present

## 2016-10-09 DIAGNOSIS — M542 Cervicalgia: Secondary | ICD-10-CM | POA: Diagnosis not present

## 2016-10-09 DIAGNOSIS — M9901 Segmental and somatic dysfunction of cervical region: Secondary | ICD-10-CM | POA: Diagnosis not present

## 2016-10-09 DIAGNOSIS — M546 Pain in thoracic spine: Secondary | ICD-10-CM | POA: Diagnosis not present

## 2016-10-11 ENCOUNTER — Ambulatory Visit (INDEPENDENT_AMBULATORY_CARE_PROVIDER_SITE_OTHER): Payer: BLUE CROSS/BLUE SHIELD

## 2016-10-11 DIAGNOSIS — Z23 Encounter for immunization: Secondary | ICD-10-CM

## 2016-10-16 DIAGNOSIS — M546 Pain in thoracic spine: Secondary | ICD-10-CM | POA: Diagnosis not present

## 2016-10-16 DIAGNOSIS — M9902 Segmental and somatic dysfunction of thoracic region: Secondary | ICD-10-CM | POA: Diagnosis not present

## 2016-10-16 DIAGNOSIS — M542 Cervicalgia: Secondary | ICD-10-CM | POA: Diagnosis not present

## 2016-10-16 DIAGNOSIS — M9901 Segmental and somatic dysfunction of cervical region: Secondary | ICD-10-CM | POA: Diagnosis not present

## 2016-10-30 DIAGNOSIS — M9901 Segmental and somatic dysfunction of cervical region: Secondary | ICD-10-CM | POA: Diagnosis not present

## 2016-10-30 DIAGNOSIS — M542 Cervicalgia: Secondary | ICD-10-CM | POA: Diagnosis not present

## 2016-10-30 DIAGNOSIS — M9902 Segmental and somatic dysfunction of thoracic region: Secondary | ICD-10-CM | POA: Diagnosis not present

## 2016-10-30 DIAGNOSIS — M546 Pain in thoracic spine: Secondary | ICD-10-CM | POA: Diagnosis not present

## 2017-03-28 ENCOUNTER — Other Ambulatory Visit: Payer: Self-pay | Admitting: Family Medicine

## 2017-04-07 ENCOUNTER — Ambulatory Visit (INDEPENDENT_AMBULATORY_CARE_PROVIDER_SITE_OTHER): Payer: BLUE CROSS/BLUE SHIELD | Admitting: Family Medicine

## 2017-04-07 ENCOUNTER — Encounter: Payer: Self-pay | Admitting: Family Medicine

## 2017-04-07 VITALS — BP 124/72 | Temp 98.3°F | Ht 62.0 in | Wt 152.0 lb

## 2017-04-07 DIAGNOSIS — J310 Chronic rhinitis: Secondary | ICD-10-CM

## 2017-04-07 DIAGNOSIS — J329 Chronic sinusitis, unspecified: Secondary | ICD-10-CM | POA: Diagnosis not present

## 2017-04-07 DIAGNOSIS — J4521 Mild intermittent asthma with (acute) exacerbation: Secondary | ICD-10-CM

## 2017-04-07 MED ORDER — AMOXICILLIN 500 MG PO CAPS: 500.0000 mg | ORAL_CAPSULE | Freq: Three times a day (TID) | ORAL | 0 refills | Status: DC

## 2017-04-07 MED ORDER — LORATADINE 10 MG PO TABS: 10.0000 mg | ORAL_TABLET | Freq: Every day | ORAL | 3 refills | Status: DC

## 2017-04-07 NOTE — Progress Notes (Signed)
   Subjective:    Patient ID: Joaquim NamWendy S Mihok, female    DOB: 03/01/1971, 46 y.o.   MRN: [id number]  Sinusitis  This is a new problem. Episode onset: 2 weeks. Associated symptoms include congestion, coughing and headaches. Treatments tried: mucinex, loratadine, liquids.   A little tight  Some prod cough   Using mucinex not dm  Was on nyquil qhs       Frontal headache and cong    No majr hx of bronchial Using  No inhaler in the past    Review of Systems  HENT: Positive for congestion.   Respiratory: Positive for cough.   Neurological: Positive for headaches.       Objective:   Physical Exam  Alert, mild malaise. Hydration good Vitals stable. frontal/ maxillary tenderness evident positive nasal congestion. pharynx normal neck supple  lungs clear/no crackles Mild positive wheezes. heart regular in rhythm       Assessment & Plan:  Impression rhinosinusitis/bronchitis with reactive airways. First for patient. Proper inhaler use discussed. Antibiotics and inhaler prescribed. Patient wishes to use her son's inhaler for now

## 2017-04-10 ENCOUNTER — Telehealth: Payer: Self-pay | Admitting: Family Medicine

## 2017-04-10 ENCOUNTER — Other Ambulatory Visit: Payer: Self-pay | Admitting: *Deleted

## 2017-04-10 MED ORDER — CEFDINIR 300 MG PO CAPS: 300.0000 mg | ORAL_CAPSULE | Freq: Two times a day (BID) | ORAL | 0 refills | Status: DC

## 2017-04-10 NOTE — Telephone Encounter (Signed)
Patient was seen Monday. Feels like the cough is getting better but her head congestion is getting worse. Has been taking meds but now wondering if she needs a stronger antibiotic? Uses [facility].

## 2017-04-10 NOTE — Telephone Encounter (Signed)
omnicef 300 bid ten d 

## 2017-04-10 NOTE — Telephone Encounter (Signed)
Med sent to pharm. Pt notified.  

## 2017-04-18 ENCOUNTER — Telehealth: Payer: Self-pay | Admitting: Family Medicine

## 2017-04-18 ENCOUNTER — Other Ambulatory Visit: Payer: Self-pay | Admitting: *Deleted

## 2017-04-18 MED ORDER — LEVOFLOXACIN 500 MG PO TABS: 500.0000 mg | ORAL_TABLET | Freq: Every day | ORAL | 0 refills | Status: DC

## 2017-04-18 NOTE — Telephone Encounter (Signed)
I recommend Levaquin 500 mg 1 daily for 10 days if still ongoing troubles despite best then the next step would be to get rechecked

## 2017-04-18 NOTE — Telephone Encounter (Signed)
Seen 5/14 for rhinosinusitis/bronchitis with reactive airways. Prescribed amoxil 500 one tid for 10 days, then called on 5/17 not better. Prescribed omnicef 300mg  one bid for 10 days. On the 8th day of omnicef and cough is much better, a little wheezing but not using the inhaler as much still has sinus pressure and nose stopped up. No fever.

## 2017-04-18 NOTE — Telephone Encounter (Signed)
Discussed with pt and med sent to pharm.  

## 2017-04-18 NOTE — Telephone Encounter (Signed)
Pt called stating that she is not getting any better. Pt is wanting to know is something stronger can be called in. Please advise.    [facility]

## 2017-10-24 ENCOUNTER — Ambulatory Visit: Payer: BLUE CROSS/BLUE SHIELD | Admitting: Obstetrics and Gynecology

## 2017-12-01 ENCOUNTER — Ambulatory Visit: Payer: BLUE CROSS/BLUE SHIELD | Admitting: Obstetrics and Gynecology

## 2017-12-26 ENCOUNTER — Other Ambulatory Visit: Payer: Self-pay | Admitting: Obstetrics and Gynecology

## 2017-12-26 ENCOUNTER — Other Ambulatory Visit (HOSPITAL_COMMUNITY)
Admission: RE | Admit: 2017-12-26 | Discharge: 2017-12-26 | Disposition: A | Discharge status: Home / Self Care | Payer: BLUE CROSS/BLUE SHIELD | Source: Ambulatory Visit | Attending: Obstetrics and Gynecology | Admitting: Obstetrics and Gynecology

## 2017-12-26 ENCOUNTER — Ambulatory Visit: Payer: BLUE CROSS/BLUE SHIELD | Admitting: Obstetrics and Gynecology

## 2017-12-26 ENCOUNTER — Other Ambulatory Visit: Payer: Self-pay

## 2017-12-26 ENCOUNTER — Encounter: Payer: Self-pay | Admitting: Obstetrics and Gynecology

## 2017-12-26 VITALS — BP 116/78 | HR 72 | Resp 16 | Ht 61.5 in | Wt 151.0 lb

## 2017-12-26 DIAGNOSIS — Z1151 Encounter for screening for human papillomavirus (HPV): Secondary | ICD-10-CM | POA: Insufficient documentation

## 2017-12-26 DIAGNOSIS — Z01419 Encounter for gynecological examination (general) (routine) without abnormal findings: Secondary | ICD-10-CM | POA: Insufficient documentation

## 2017-12-26 DIAGNOSIS — Z1231 Encounter for screening mammogram for malignant neoplasm of breast: Secondary | ICD-10-CM

## 2017-12-26 NOTE — Patient Instructions (Signed)

## 2017-12-26 NOTE — Progress Notes (Signed)
47 y.o. G68P1011 Married Caucasian female here for annual exam.    Menses regular.  Light menses.  PCP:   Dr. Vilinda Blanks. [place]   Patient's last menstrual period was 12/11/2017.           Sexually active: Yes.    The current method of family planning is vasectomy.    Exercising: No.  The patient does not participate in regular exercise at present. Smoker:  no  Health Maintenance: Pap:  09-09-14 Neg:Neg HR HPV History of abnormal Pap:  Yes, Had colposcopy in her early 26s but doesn't think she had any treatment to cervix. MMG:  03/14/16 BIRADS 1 negative/density c -- scheduled 01/23/18 Colonoscopy:  2009 normal BMD:   n/a  Result  n/a TDaP:  2015 Gardasil:   n/a HIV: neg in pregnancy  Screening Labs: discuss today   reports that  has never smoked. she has never used smokeless tobacco. She reports that she drinks about 3.0 oz of alcohol per week. She reports that she does not use drugs.  Past Medical History:  Diagnosis Date  . Abnormal Pap smear of cervix   . Hypertriglyceridemia     Past Surgical History:  Procedure Laterality Date  . cystoclel repair  2009    Current Outpatient Medications  Medication Sig Dispense Refill  . Calcium Carbonate-Vitamin D (CALCIUM + D PO) Take by mouth daily.    Marland Kitchen loratadine (CLARITIN) 10 MG tablet Take 1 tablet (10 mg total) by mouth daily. 30 tablet 3  . Multiple Vitamin (MULTIVITAMIN) tablet Take 1 tablet by mouth daily.    . Omega-3 Fatty Acids (FISH OIL PO) Take by mouth.     No current facility-administered medications for this visit.     Family History  Problem Relation Age of Onset  . Hypertension Mother   . Arthritis Mother   . Anxiety disorder Mother   . Heart disease Father   . Colitis Father   . Breast cancer Paternal Aunt   . Cancer Maternal Grandmother        Ovarian    ROS:  Pertinent items are noted in HPI.  Otherwise, a comprehensive ROS was negative.  Exam:   BP 116/78 (BP Location: Right Arm, Patient  Position: Sitting, Cuff Size: Normal)   Pulse 72   Resp 16   Ht 5' 1.5" (1.562 m)   Wt 151 lb (68.5 kg)   LMP 12/11/2017   BMI 28.07 kg/m     General appearance: alert, cooperative and appears stated age Head: Normocephalic, without obvious abnormality, atraumatic Neck: no adenopathy, supple, symmetrical, trachea midline and thyroid normal to inspection and palpation Lungs: clear to auscultation bilaterally Breasts: normal appearance, no masses or tenderness, No nipple retraction or dimpling, No nipple discharge or bleeding, No axillary or supraclavicular adenopathy Heart: regular rate and rhythm Abdomen: soft, non-tender; no masses, no organomegaly Extremities: extremities normal, atraumatic, no cyanosis or edema Skin: Skin color, texture, turgor normal. No rashes or lesions Lymph nodes: Cervical, supraclavicular, and axillary nodes normal. No abnormal inguinal nodes palpated Neurologic: Grossly normal  Pelvic: External genitalia:  no lesions              Urethra:  normal appearing urethra with no masses, tenderness or lesions              Bartholins and Skenes: normal                 Vagina: normal appearing vagina with normal color and discharge, no lesions  Cervix: no lesions              Pap taken: Yes.   Bimanual Exam:  Uterus:  normal size, contour, position, consistency, mobility, non-tender              Adnexa: no mass, fullness, tenderness              Rectal exam: Yes.  .  Confirms.              Anus:  normal sphincter tone, no lesions  Chaperone was present for exam.  Assessment:   Well woman visit with normal exam. FH ovarian cancer and breast cancer.  Different sides of the family.  Not first degree relatives. Hyperlipidemia.   Plan: Mammogram screening discussed.  Reviewed 3D. Recommended self breast awareness. Pap and HR HPV as above. Guidelines for Calcium, Vitamin D, regular exercise program including cardiovascular and weight bearing  exercise. Colonoscopy recommended.  Fasting labs with PCP.  Follow up annually and prn.   After visit summary provided.

## 2017-12-30 LAB — CYTOLOGY - PAP
Diagnosis: NEGATIVE
HPV (WINDOPATH): NOT DETECTED

## 2018-01-23 ENCOUNTER — Ambulatory Visit
Admission: RE | Admit: 2018-01-23 | Discharge: 2018-01-23 | Disposition: A | Discharge status: Home / Self Care | Payer: BLUE CROSS/BLUE SHIELD | Source: Ambulatory Visit | Attending: Obstetrics and Gynecology | Admitting: Obstetrics and Gynecology

## 2018-01-23 DIAGNOSIS — Z1231 Encounter for screening mammogram for malignant neoplasm of breast: Secondary | ICD-10-CM

## 2018-03-30 DIAGNOSIS — D1800 Hemangioma unspecified site: Secondary | ICD-10-CM | POA: Diagnosis not present

## 2018-03-30 DIAGNOSIS — L988 Other specified disorders of the skin and subcutaneous tissue: Secondary | ICD-10-CM | POA: Diagnosis not present

## 2018-03-30 DIAGNOSIS — D235 Other benign neoplasm of skin of trunk: Secondary | ICD-10-CM | POA: Diagnosis not present

## 2018-03-30 DIAGNOSIS — D485 Neoplasm of uncertain behavior of skin: Secondary | ICD-10-CM | POA: Diagnosis not present

## 2018-04-15 ENCOUNTER — Encounter: Payer: Self-pay | Admitting: Family Medicine

## 2018-04-15 ENCOUNTER — Ambulatory Visit: Payer: BLUE CROSS/BLUE SHIELD | Admitting: Family Medicine

## 2018-04-15 VITALS — BP 130/78 | Temp 98.4°F | Ht 61.5 in | Wt 151.0 lb

## 2018-04-15 DIAGNOSIS — K2100 Gastro-esophageal reflux disease with esophagitis, without bleeding: Secondary | ICD-10-CM

## 2018-04-15 DIAGNOSIS — Z13220 Encounter for screening for lipoid disorders: Secondary | ICD-10-CM | POA: Diagnosis not present

## 2018-04-15 DIAGNOSIS — N6002 Solitary cyst of left breast: Secondary | ICD-10-CM

## 2018-04-15 DIAGNOSIS — R5383 Other fatigue: Secondary | ICD-10-CM

## 2018-04-15 DIAGNOSIS — Z1329 Encounter for screening for other suspected endocrine disorder: Secondary | ICD-10-CM

## 2018-04-15 MED ORDER — PANTOPRAZOLE SODIUM 40 MG PO TBEC: DELAYED_RELEASE_TABLET | ORAL | 2 refills | Status: DC

## 2018-04-15 MED ORDER — HYOSCYAMINE SULFATE SL 0.125 MG SL SUBL: SUBLINGUAL_TABLET | SUBLINGUAL | 0 refills | Status: DC

## 2018-04-15 NOTE — Progress Notes (Signed)
   Subjective:    Patient ID: Renee Cordova, female    DOB: [birth year], 47 y.o.   MRN: [id number]   Abdominal Pain   This is a new problem. The current episode started more than 1 month ago. The pain is located in the generalized abdominal region. Associated symptoms include diarrhea. Associated symptoms comments: Gas pains. Treatments tried: Gas-X and Immodium   The treatment provided mild relief.  Pt states the pain starts up after lunch,only when she eats out. Typically goes out for pizza or [place] at lunch.  When she eats lunch at home, stomach does not bother her.  Over the weekend, she noticed a lump in her left breast. No pain or tenderness. Had mammogram in March which came back normal.  Change in abd smtoms the last month or so, gets  , pizza and or , [place] or [place]  .  Loose stools diarhea within an hr or so of eating,, ccramping diarrhea gas like pain    Pt has had intermittent pains for over an hr at times  Using immod and gas ex                Protonix 49 each morning she is specialist did she mention about the blood work specialist to do so she signed a hold off but is doing okay see thank you with the man and will see what the nurse recorded check Largest meal of the day,  Often assoc with after meals   Pos chron's diseas and ulcerative colitis in the family  Fa had lots of gallsotiones once with pancreatiis  No major gi , symtoms  Also encouraged to get blood work done     No fam hx of cancer   Review of Systems  Gastrointestinal: Positive for abdominal pain and diarrhea.       Objective:   Physical Exam  Alert and oriented, vitals reviewed and stable, NAD ENT-TM's and ext canals WNL bilat via otoscopic exam Soft palate, tonsils and post pharynx WNL via oropharyngeal exam Neck-symmetric, no masses; thyroid nonpalpable and nontender Pulmonary-no tachypnea or accessory muscle use; Clear without wheezes via auscultation Card--no  abnrml murmurs, rhythm reg and rate WNL Carotid pulses symmetric, without bruits Left breast subareolar palpable cystic smooth freely movable/patient states definitely smaller than 1 week ago  Abdomen excellent bowel sounds.  Diffuse mild tenderness to deep palpation no discrete tenderness.  No organomegaly no masses      Assessment & Plan:  1 #1 GI symptoms most likely consistent with element of reflux esophagitis/gastritis exacerbated by spicy foods.  2.  Some loose stools question irritable bowel component  3.  Breast cyst clinically improved.  Patient would like to hold off on further work-up.  Just had a negative mammogram.  Advised if if breast cyst disappears fine if it does not to follow-up with her GYN  4.  Blood work done at patient's request ordered by her OB/GYN  Trial of Protonix.Daily.  Levsin as needed for abdominal cramps.  Symptom care discussed  Symptom care discussed call us several weeks/away if not improvement on medications left breast cyst

## 2018-04-21 DIAGNOSIS — Z13220 Encounter for screening for lipoid disorders: Secondary | ICD-10-CM | POA: Diagnosis not present

## 2018-04-21 DIAGNOSIS — Z1329 Encounter for screening for other suspected endocrine disorder: Secondary | ICD-10-CM | POA: Diagnosis not present

## 2018-04-21 DIAGNOSIS — R5383 Other fatigue: Secondary | ICD-10-CM | POA: Diagnosis not present

## 2018-04-22 LAB — BASIC METABOLIC PANEL
BUN / CREAT RATIO: 16 (ref 9–23)
BUN: 12 mg/dL (ref 6–24)
CALCIUM: 9.6 mg/dL (ref 8.7–10.2)
CHLORIDE: 104 mmol/L (ref 96–106)
CO2: 24 mmol/L (ref 20–29)
CREATININE: 0.77 mg/dL (ref 0.57–1.00)
GFR calc non Af Amer: 93 mL/min/{1.73_m2} (ref >59)
GFR, EST AFRICAN AMERICAN: 107 mL/min/{1.73_m2} (ref >59)
GLUCOSE: 88 mg/dL (ref 65–99)
Potassium: 4.3 mmol/L (ref 3.5–5.2)
Sodium: 144 mmol/L (ref 134–144)

## 2018-04-22 LAB — HEPATIC FUNCTION PANEL
ALBUMIN: 4.4 g/dL (ref 3.5–5.5)
ALK PHOS: 56 IU/L (ref 39–117)
ALT: 12 IU/L (ref 0–32)
AST: 18 IU/L (ref 0–40)
Bilirubin Total: 0.3 mg/dL (ref 0.0–1.2)
Bilirubin, Direct: 0.08 mg/dL (ref 0.00–0.40)
TOTAL PROTEIN: 7.2 g/dL (ref 6.0–8.5)

## 2018-04-22 LAB — CBC WITH DIFFERENTIAL/PLATELET
BASOS: 0 %
Basophils Absolute: 0 10*3/uL (ref 0.0–0.2)
EOS (ABSOLUTE): 0.1 10*3/uL (ref 0.0–0.4)
EOS: 2 %
HEMATOCRIT: 40.4 % (ref 34.0–46.6)
Hemoglobin: 13.7 g/dL (ref 11.1–15.9)
IMMATURE GRANS (ABS): 0 10*3/uL (ref 0.0–0.1)
IMMATURE GRANULOCYTES: 0 %
LYMPHS: 29 %
Lymphocytes Absolute: 2 10*3/uL (ref 0.7–3.1)
MCH: 30.6 pg (ref 26.6–33.0)
MCHC: 33.9 g/dL (ref 31.5–35.7)
MCV: 90 fL (ref 79–97)
MONOCYTES: 8 %
MONOS ABS: 0.6 10*3/uL (ref 0.1–0.9)
NEUTROS PCT: 61 %
Neutrophils Absolute: 4.2 10*3/uL (ref 1.4–7.0)
Platelets: 337 10*3/uL (ref 150–450)
RBC: 4.48 x10E6/uL (ref 3.77–5.28)
RDW: 13.2 % (ref 12.3–15.4)
WBC: 6.9 10*3/uL (ref 3.4–10.8)

## 2018-04-22 LAB — LIPID PANEL
CHOL/HDL RATIO: 3.4 ratio (ref 0.0–4.4)
Cholesterol, Total: 179 mg/dL (ref 100–199)
HDL: 53 mg/dL (ref >39)
LDL CALC: 100 mg/dL — AB (ref 0–99)
Triglycerides: 131 mg/dL (ref 0–149)
VLDL CHOLESTEROL CAL: 26 mg/dL (ref 5–40)

## 2018-04-22 LAB — VITAMIN D 25 HYDROXY (VIT D DEFICIENCY, FRACTURES): Vit D, 25-Hydroxy: 34.9 ng/mL (ref 30.0–100.0)

## 2018-04-22 LAB — TSH: TSH: 3.3 u[IU]/mL (ref 0.450–4.500)

## 2018-04-26 ENCOUNTER — Encounter: Payer: Self-pay | Admitting: Family Medicine

## 2018-08-25 ENCOUNTER — Ambulatory Visit (INDEPENDENT_AMBULATORY_CARE_PROVIDER_SITE_OTHER): Payer: BLUE CROSS/BLUE SHIELD | Admitting: *Deleted

## 2018-08-25 ENCOUNTER — Ambulatory Visit: Payer: Self-pay

## 2018-08-25 DIAGNOSIS — Z23 Encounter for immunization: Secondary | ICD-10-CM

## 2018-09-29 DIAGNOSIS — F4321 Adjustment disorder with depressed mood: Secondary | ICD-10-CM | POA: Diagnosis not present

## 2018-09-30 ENCOUNTER — Encounter: Payer: Self-pay | Admitting: Family Medicine

## 2018-09-30 ENCOUNTER — Ambulatory Visit: Payer: BLUE CROSS/BLUE SHIELD | Admitting: Family Medicine

## 2018-09-30 DIAGNOSIS — F321 Major depressive disorder, single episode, moderate: Secondary | ICD-10-CM | POA: Diagnosis not present

## 2018-09-30 MED ORDER — ESCITALOPRAM OXALATE 10 MG PO TABS: 10.0000 mg | ORAL_TABLET | Freq: Every day | ORAL | 2 refills | Status: DC

## 2018-09-30 NOTE — Progress Notes (Signed)
   Subjective:    Patient ID: Renee Cordova, female    DOB: 1971-09-25, 47 y.o.   MRN: [id number]  HPI  Patient arrives for a discussion on depression. Patient is currently not on any medication for depression. Patient states she has noticed the depression for 6-8 months.        Low  Feeling for the past six or so months  Has never felt this bad  Not  Enjoying usual things     Dim energy  Eating too much   Drinking too much   Trouble sleping   have trouble focusing     Fa has dementia, mo hot reallyrepojding well  Son has autim overall good school yr     husb has ben dxed with prost cancer  An now facing financial challenges   No fam tendncy towards depression  Other than sister with certain mental vhallenges of anger and subs abuse  Fairly reg menses , not hot flashes     Not thinking about hruting self      Review of Systems No headache, no major weight loss or weight gain, no chest pain no back pain abdominal pain no change in bowel habits complete ROS otherwise negative     Objective:   Physical Exam  Alert and oriented, vitals reviewed and stable, NAD ENT-TM's and ext canals WNL bilat via otoscopic exam Soft palate, tonsils and post pharynx WNL via oropharyngeal exam Neck-symmetric, no masses; thyroid nonpalpable and nontender Pulmonary-no tachypnea or accessory muscle use; Clear without wheezes via auscultation Card--no abnrml murmurs, rhythm reg and rate WNL Carotid pulses symmetric, without bruits       Assessment & Plan:  Impression depression.  Moderate.  Discussed.  Patient has substantial elements of diminished mood, diminished energy, sad thoughts, crying spells, etc.  Quite a bit of stress.  Husband recently diagnosed with prostate cancer.  Challenges with work.  Son has autism and facing his own challenges.  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  In recent years ago has had passive challenges with mood.  Now feels in need of help.  Very long discussion held about the nature of depression.  Both endogenous and exogenous features.  Discussion held regarding medications.  Multiple questions answered.  Lexapro 10 mg daily initiated.  Exercise encouraged.  Follow-up as scheduled.  Greater than 50% of this 25 minute face to face visit was spent in counseling and discussion and coordination of care regarding the above diagnosis/diagnosies

## 2018-10-03 DIAGNOSIS — F321 Major depressive disorder, single episode, moderate: Secondary | ICD-10-CM | POA: Insufficient documentation

## 2018-10-14 DIAGNOSIS — F4321 Adjustment disorder with depressed mood: Secondary | ICD-10-CM | POA: Diagnosis not present

## 2018-11-02 DIAGNOSIS — F4321 Adjustment disorder with depressed mood: Secondary | ICD-10-CM | POA: Diagnosis not present

## 2018-11-10 ENCOUNTER — Ambulatory Visit: Payer: BLUE CROSS/BLUE SHIELD | Admitting: Family Medicine

## 2018-11-11 ENCOUNTER — Encounter: Payer: Self-pay | Admitting: Family Medicine

## 2018-11-11 ENCOUNTER — Ambulatory Visit: Payer: BLUE CROSS/BLUE SHIELD | Admitting: Family Medicine

## 2018-11-11 VITALS — BP 110/70 | Ht 61.5 in | Wt 148.2 lb

## 2018-11-11 DIAGNOSIS — F321 Major depressive disorder, single episode, moderate: Secondary | ICD-10-CM

## 2018-11-11 MED ORDER — ESCITALOPRAM OXALATE 10 MG PO TABS: 10.0000 mg | ORAL_TABLET | Freq: Every day | ORAL | 5 refills | Status: DC

## 2018-11-11 NOTE — Progress Notes (Signed)
   Subjective:    Patient ID: Renee Cordova, female    DOB: 1971-09-26, 47 y.o.   MRN: [id number]  HPIFollow up on starting lexapro 6 weeks ago. Pt states no concerns with med. lexapro working well.   meds have helped a lot  Patient states sadness has improved remarkably.  Minimal side effects from the medicine.  Patient notes some weight gain.  Mother feels this is due to her appetite returning.  Patient is exercising regularly.  Patient definitely motivated to stay on medication for now    Review of Systems No headache, no major weight loss or weight gain, no chest pain no back pain abdominal pain no change in bowel habits complete ROS otherwise negative     Objective:   Physical Exam  Alert vitals stable, NAD. Blood pressure good on repeat. HEENT normal. Lungs clear. Heart regular rate and rhythm.       Assessment & Plan:  Impression depression.  Clinically substantially improved.  Tolerating meds well.  Will maintain next 6 months.  And then assess for weaning or maintenance at that point rationale discussed with patient  Greater than 50% of this 15 minute face to face visit was spent in counseling and discussion and coordination of care regarding the above diagnosis/diagnosies

## 2018-12-22 ENCOUNTER — Encounter: Payer: Self-pay | Admitting: Family Medicine

## 2018-12-22 ENCOUNTER — Ambulatory Visit (INDEPENDENT_AMBULATORY_CARE_PROVIDER_SITE_OTHER): Payer: BLUE CROSS/BLUE SHIELD | Admitting: Family Medicine

## 2018-12-22 VITALS — BP 112/72 | Wt 141.8 lb

## 2018-12-22 DIAGNOSIS — F321 Major depressive disorder, single episode, moderate: Secondary | ICD-10-CM

## 2018-12-22 DIAGNOSIS — R232 Flushing: Secondary | ICD-10-CM | POA: Diagnosis not present

## 2018-12-22 NOTE — Progress Notes (Signed)
   Subjective:    Patient ID: Renee Cordova, female    DOB: [birth year], 48 y.o.   MRN: [id number]  HPI  Patient arrives for a follow up on depression. Patient states she is doing well on medication and has no problems or concerns.  Having night sweats and some challenges in this regard.  1 years duration.  Worse at nighttime.  Current medicine has not helped much.  Wonders about hormone supplement safe.  Admits  Exercising fairly now.  Patient notes ongoing compliance with antidepressant medication. No obvious side effects. Reports does not miss a dose. Overall continues to help depression substantially. No thoughts of homicide or suicide. Would like to maintain medication.   Review of Systems No headache, no major weight loss or weight gain, no chest pain no back pain abdominal pain no change in bowel habits complete ROS otherwise negative     Objective:   Physical Exam  Alert vitals stable, NAD. Blood pressure good on repeat. HEENT normal. Lungs clear. Heart regular rate and rhythm.       Assessment & Plan:  Impression #1 depression/anxiety patient states overall well controlled with current meds discussed to maintain the same.  Compliance discussed  2.  Probable early perimenopause.  Most oral contraceptives with multiple physician not on any type of nicotine.  Slight increased risk of serious problems discussed.  Best delivered by the clinician following woman's preventive health aspects discussed patient to take up with her GYN follow-up in 6 months

## 2019-01-08 ENCOUNTER — Other Ambulatory Visit: Payer: Self-pay

## 2019-01-08 ENCOUNTER — Ambulatory Visit: Payer: BLUE CROSS/BLUE SHIELD | Admitting: Obstetrics and Gynecology

## 2019-01-08 ENCOUNTER — Encounter: Payer: Self-pay | Admitting: Obstetrics and Gynecology

## 2019-01-08 VITALS — BP 102/76 | HR 86 | Resp 14 | Ht 62.0 in | Wt 137.5 lb

## 2019-01-08 DIAGNOSIS — Z1211 Encounter for screening for malignant neoplasm of colon: Secondary | ICD-10-CM

## 2019-01-08 DIAGNOSIS — Z01419 Encounter for gynecological examination (general) (routine) without abnormal findings: Secondary | ICD-10-CM | POA: Diagnosis not present

## 2019-01-08 MED ORDER — DESOGESTREL-ETHINYL ESTRADIOL 0.15-0.02/0.01 MG (21/5) PO TABS: 1.0000 | ORAL_TABLET | Freq: Every day | ORAL | 11 refills | Status: DC

## 2019-01-08 NOTE — Patient Instructions (Signed)

## 2019-01-08 NOTE — Progress Notes (Signed)
48 y.o. G24P1011 Married Caucasian female here for annual exam.    Painful menstruation.  Short and light menses. Night sweats.   Used a birth control pill in the past.  She used Seasonale and had breast cysts.  Ortho Novum as well.   Denies HTN, migraine with aura, breast or liver disease, DVT/PE or FH of this.   Husband having treatment of prostate cancer.  Her father has advanced stage dementia.  PCP:   Lubertha South, MD   Patient's last menstrual period was 12/31/2018.     Period Cycle (Days): 30 Period Duration (Days): 3 Period Pattern: Regular Menstrual Flow: Light, Moderate Menstrual Control: Maxi pad, Tampon Menstrual Control Change Freq (Hours): changes tampon/pad every 4 hours Dysmenorrhea: (!) Moderate Dysmenorrhea Symptoms: Cramping, Diarrhea     Sexually active: Yes.    The current method of family planning is vasectomy.    Exercising: No.  The patient does not participate in regular exercise at present. Smoker:  no  Health Maintenance: Pap: 12-26-17 Neg:Neg HR HPV, 09-09-14 Neg:Neg HR HPV History of abnormal Pap:  Yes, Had colposcopy in her early 65s but doesn't think she had any treatment to cervix. MMG: 01-23-18 3D Neg/density b/BiRads1 Colonoscopy: 2009 normal BMD:   n/a Result  n/a TDaP: 2015 Gardasil:   no HIV: Neg in pregnancy Hep C: no   Screening Labs:  Hb today: PCP, Urine today: not collected   reports that she has never smoked. She has never used smokeless tobacco. She reports current alcohol use of about 5.0 standard drinks of alcohol per week. She reports that she does not use drugs.  Past Medical History:  Diagnosis Date  . Abnormal Pap smear of cervix   . Hypertriglyceridemia     Past Surgical History:  Procedure Laterality Date  . BREAST CYST ASPIRATION Left   . cystoclel repair  2009    Current Outpatient Medications  Medication Sig Dispense Refill  . Calcium Carbonate-Vitamin D (CALCIUM + D PO) Take by mouth daily.    Marland Kitchen  escitalopram (LEXAPRO) 10 MG tablet Take 1 tablet (10 mg total) by mouth daily. 30 tablet 5  . loratadine (CLARITIN) 10 MG tablet Take 1 tablet (10 mg total) by mouth daily. 30 tablet 3  . Multiple Vitamin (MULTIVITAMIN) tablet Take 1 tablet by mouth daily.    . Omega-3 Fatty Acids (FISH OIL PO) Take by mouth.     No current facility-administered medications for this visit.     Family History  Problem Relation Age of Onset  . Hypertension Mother   . Arthritis Mother   . Anxiety disorder Mother   . Heart disease Father   . Colitis Father   . Breast cancer Paternal Aunt   . Cancer Maternal Grandmother        Ovarian    Review of Systems  Endocrine: Positive for heat intolerance.       Night sweats  Genitourinary: Positive for menstrual problem.       Painful periods Menstrual cycle changes   Psychiatric/Behavioral: Positive for dysphoric mood.  All other systems reviewed and are negative.   Exam:   BP 102/76 (BP Location: Right Arm, Patient Position: Sitting, Cuff Size: Normal)   Pulse 86   Resp 14   Ht 5\' 2"  (1.575 m)   Wt 137 lb 8 oz (62.4 kg)   LMP 12/31/2018   BMI 25.15 kg/m     General appearance: alert, cooperative and appears stated age Head: Normocephalic, without obvious abnormality, atraumatic  Neck: no adenopathy, supple, symmetrical, trachea midline and thyroid normal to inspection and palpation Lungs: clear to auscultation bilaterally Breasts: normal appearance, no masses or tenderness, No nipple retraction or dimpling, No nipple discharge or bleeding, No axillary or supraclavicular adenopathy Heart: regular rate and rhythm Abdomen: soft, non-tender; no masses, no organomegaly Extremities: extremities normal, atraumatic, no cyanosis or edema Skin: Skin color, texture, turgor normal. No rashes or lesions Lymph nodes: Cervical, supraclavicular, and axillary nodes normal. No abnormal inguinal nodes palpated Neurologic: Grossly normal  Pelvic: External  genitalia:  no lesions              Urethra:  normal appearing urethra with no masses, tenderness or lesions              Bartholins and Skenes: normal                 Vagina: normal appearing vagina with normal color and discharge, no lesions              Cervix: no lesions              Pap taken: No. Bimanual Exam:  Uterus:  normal size, contour, position, consistency, mobility, non-tender              Adnexa: no mass, fullness, tenderness              Rectal exam: Yes.  .  Confirms.              Anus:  normal sphincter tone, no lesions  Chaperone was present for exam.  Assessment:   Well woman visit with normal exam. Remote hx of abnormal pap.  Depression.  Dysmenorrhea.  Hot flashes.   Plan: Mammogram screening. Recommended self breast awareness. Pap and HR HPV as above. Guidelines for Calcium, Vitamin D, regular exercise program including cardiovascular and weight bearing exercise. Will start Mircette.  Instructed in use and I reminded her of warning signs and increased risk of MI, stroke, DVT, and PE while taking combined contraception.  We discussed the benefits as well. IFOB. Follow up annually and prn.   After visit summary provided.

## 2019-02-17 ENCOUNTER — Telehealth: Payer: Self-pay | Admitting: Family Medicine

## 2019-02-17 NOTE — Telephone Encounter (Signed)
thx

## 2019-02-17 NOTE — Telephone Encounter (Signed)
Contacted patient per provider to let her know that the materials will not be ready for pickup on Thursday as planned. Materials will be ready on Friday morning. Left message to return call. Pt returned call right away and was informed that material would be ready for pickup on Friday morning. Pt verbalized understanding.

## 2019-03-08 ENCOUNTER — Other Ambulatory Visit: Payer: Self-pay | Admitting: Obstetrics and Gynecology

## 2019-03-08 DIAGNOSIS — Z1231 Encounter for screening mammogram for malignant neoplasm of breast: Secondary | ICD-10-CM

## 2019-03-24 ENCOUNTER — Encounter: Payer: Self-pay | Admitting: Obstetrics and Gynecology

## 2019-03-24 ENCOUNTER — Telehealth: Payer: Self-pay | Admitting: Obstetrics and Gynecology

## 2019-03-24 NOTE — Telephone Encounter (Signed)
Spoke with patient. Patient states that she has a left breast mass that she noticed 1 week ago. Breasts have been tender since being started on low dose OCP. Patient stopped OCP. Reports the lump can be palpated and is flesh colored. Denies any redness, swelling, or warmth to the breast. States that she has a history of breast cysts and is confident this is what it is. Appointment scheduled for tomorrow 03/25/2019 at 11 am with Dr.Silva. Patient is agreeable to date and time.  Routing to provider and will close encounter.

## 2019-03-24 NOTE — Telephone Encounter (Signed)
Patient sent the following message through MyChart. Routing to triage to assist patient with request.  Dr. Edward Jolly,    I have a breast cyst again, like I tended to get when I was on a birth control pill years ago. It is large and sudden, just like they were in the past, so I'm not worried it's anything other than a cyst. I stopped the low-dose pill. I already have a routine mammogram scheduled in June, but could I come in to see if you could aspirate this cyst?    Renee Cordova

## 2019-03-25 ENCOUNTER — Other Ambulatory Visit: Payer: Self-pay

## 2019-03-25 ENCOUNTER — Ambulatory Visit: Payer: Managed Care, Other (non HMO) | Admitting: Obstetrics and Gynecology

## 2019-03-25 ENCOUNTER — Encounter: Payer: Self-pay | Admitting: Obstetrics and Gynecology

## 2019-03-25 VITALS — BP 102/64 | HR 64 | Temp 98.4°F | Resp 16 | Wt 141.0 lb

## 2019-03-25 DIAGNOSIS — N6002 Solitary cyst of left breast: Secondary | ICD-10-CM | POA: Diagnosis not present

## 2019-03-25 NOTE — Progress Notes (Signed)
GYNECOLOGY  VISIT   HPI: 48 y.o.   Married  Caucasian  female   G2P1011 with Patient's last menstrual period was 03/01/2019 (exact date).   here for left breast mass.  Noticed the mass 2 weeks ago.  Tender to touch.  No fever or redness of the skin.   She was in the second pack of her pills, when the mass appeared and she stopped them.  She was taking Mircette for dysmenorrhea and hot flashes at night.  This did help with the night sweats.   Has mammogram scheduled for 05/10/19.   States she has had breast cysts in the past when taking oral contraceptives.  I drained one for her in the past in the office setting.  [facility], and she reported that it was very painful and that she had extensive bruising with this.   GYNECOLOGIC HISTORY: Patient's last menstrual period was 03/01/2019 (exact date). Contraception:  vasectomy Menopausal hormone therapy:  none Last mammogram:  01-23-2018 category b density birads 1:neg Last pap smear:   12-26-2017 neg HPV HR neg        OB History    Gravida  2   Para  1   Term  1   Preterm      AB  1   Living  1     SAB      TAB      Ectopic      Multiple      Live Births                 Patient Active Problem List   Diagnosis Date Noted  . Depression, major, single episode, moderate (HCC) 10/03/2018  . Microscopic hematuria 09/09/2014  . Orthostatic hypotension 05/18/2014  . Allergic rhinitis 03/15/2013  . HEMORRHOIDS 06/28/2008  . ANAL OR RECTAL PAIN 06/28/2008  . VAGINAL LACERATION 06/28/2008  . HEMATOCHEZIA, HX OF 06/28/2008    Past Medical History:  Diagnosis Date  . Abnormal Pap smear of cervix   . Hypertriglyceridemia     Past Surgical History:  Procedure Laterality Date  . BREAST CYST ASPIRATION Left   . cystoclel repair  2009    Current Outpatient Medications  Medication Sig Dispense Refill  . Calcium Carbonate-Vitamin D (CALCIUM + D PO) Take by  mouth daily.    Marland Kitchen escitalopram (LEXAPRO) 10 MG tablet Take 1 tablet (10 mg total) by mouth daily. 30 tablet 5  . loratadine (CLARITIN) 10 MG tablet Take 1 tablet (10 mg total) by mouth daily. 30 tablet 3  . Multiple Vitamin (MULTIVITAMIN) tablet Take 1 tablet by mouth daily.    . Omega-3 Fatty Acids (FISH OIL PO) Take by mouth.    . desogestrel-ethinyl estradiol (KARIVA,AZURETTE,MIRCETTE) 0.15-0.02/0.01 MG (21/5) tablet Take 1 tablet by mouth daily. (Patient not taking: Reported on 03/25/2019) 1 Package 11   No current facility-administered medications for this visit.      ALLERGIES: Patient has no known allergies.  Family History  Problem Relation Age of Onset  . Hypertension Mother   . Arthritis Mother   . Anxiety disorder Mother   . Heart disease Father   . Colitis Father   . Breast cancer Paternal Aunt   . Cancer Maternal Grandmother        Ovarian    Social History   Socioeconomic History  . Marital status: Married    Spouse name: Not on file  . Number of children:  Not on file  . Years of education: Not on file  . Highest education level: Not on file  Occupational History  . Not on file  Social Needs  . Financial resource strain: Not on file  . Food insecurity:    Worry: Not on file    Inability: Not on file  . Transportation needs:    Medical: Not on file    Non-medical: Not on file  Tobacco Use  . Smoking status: Never Smoker  . Smokeless tobacco: Never Used  Substance and Sexual Activity  . Alcohol use: Yes    Alcohol/week: 5.0 standard drinks    Types: 5 Cans of beer per week  . Drug use: No  . Sexual activity: Yes    Partners: Male    Comment: Husband had vasectomy   Lifestyle  . Physical activity:    Days per week: Not on file    Minutes per session: Not on file  . Stress: Not on file  Relationships  . Social connections:    Talks on phone: Not on file    Gets together: Not on file    Attends religious service: Not on file    Active member of  club or organization: Not on file    Attends meetings of clubs or organizations: Not on file    Relationship status: Not on file  . Intimate partner violence:    Fear of current or ex partner: Not on file    Emotionally abused: Not on file    Physically abused: Not on file    Forced sexual activity: Not on file  Other Topics Concern  . Not on file  Social History Narrative  . Not on file    Review of Systems  Constitutional: Negative.   HENT: Negative.   Eyes: Negative.   Respiratory: Negative.   Cardiovascular: Negative.   Gastrointestinal: Negative.   Endocrine: Negative.   Genitourinary: Negative.   Musculoskeletal: Negative.   Skin:       Left breast mass with tenderness  Allergic/Immunologic: Negative.   Neurological: Negative.   Psychiatric/Behavioral: Negative.     PHYSICAL EXAMINATION:    BP 102/64   Pulse 64   Temp 98.4 F (36.9 C) (Oral)   Resp 16   Wt 141 lb (64 kg)   LMP 03/01/2019 (Exact Date)   BMI 25.79 kg/m     General appearance: alert, cooperative and appears stated age   Breasts: right - normal appearance, no masses or tenderness, No nipple retraction or dimpling, No nipple discharge or bleeding, No axillary or supraclavicular adenopathy   Left breast 3.5 cm smooth mobile mass at 12:00.  No retractions, or nodes palpable.   Procedure Drainage of breast cyst.  Consent for procedure.  Sterile prep with betadine.  1% lidocaine local - lot number [id number].  3 cc of greenish yellow turbid fluid drained from the breast and discarded.  Mass essentially gone.  Bandaid and then sterile gauze placed.  No complications.  Minimal EBL.  Chaperone was present for exam.  ASSESSMENT  Left breast cyst - drained.  PLAN  Proceed with screening mammogram in June, 2020.  Call for skin/breast redness, fever, or pain.  She wants to avoid her contraceptives for now.  She may consider trying them in the future if she is bothered by hot flashes.  FU  prn.    An After Visit Summary was printed and given to the patient.  __15____ minutes face to face time of which over  50% was spent in counseling.

## 2019-05-10 ENCOUNTER — Other Ambulatory Visit: Payer: Self-pay

## 2019-05-10 ENCOUNTER — Ambulatory Visit
Admission: RE | Admit: 2019-05-10 | Discharge: 2019-05-10 | Disposition: A | Discharge status: Home / Self Care | Payer: Managed Care, Other (non HMO) | Source: Ambulatory Visit | Attending: Obstetrics and Gynecology | Admitting: Obstetrics and Gynecology

## 2019-05-10 DIAGNOSIS — Z1231 Encounter for screening mammogram for malignant neoplasm of breast: Secondary | ICD-10-CM

## 2019-06-14 ENCOUNTER — Other Ambulatory Visit: Payer: Self-pay

## 2019-06-14 ENCOUNTER — Ambulatory Visit (INDEPENDENT_AMBULATORY_CARE_PROVIDER_SITE_OTHER): Payer: Managed Care, Other (non HMO) | Admitting: Family Medicine

## 2019-06-14 ENCOUNTER — Encounter: Payer: Self-pay | Admitting: Family Medicine

## 2019-06-14 DIAGNOSIS — F321 Major depressive disorder, single episode, moderate: Secondary | ICD-10-CM

## 2019-06-14 MED ORDER — LORATADINE 10 MG PO TABS: 10.0000 mg | ORAL_TABLET | Freq: Every day | ORAL | 11 refills | Status: DC

## 2019-06-14 MED ORDER — ESCITALOPRAM OXALATE 10 MG PO TABS: 10.0000 mg | ORAL_TABLET | Freq: Every day | ORAL | 11 refills | Status: DC

## 2019-06-14 NOTE — Progress Notes (Signed)
   Subjective:    Patient ID: Renee Cordova, female    DOB: 01/12/1971, 48 y.o.   MRN: [id number]  HPIMed check up on depression. Takes lexapro 10mg . Pt states she is doing well on med and no problems or concerns today.   Virtual Visit via Video Note  I connected with Renee Cordova on 06/14/19 at  8:30 AM EDT by a video enabled telemedicine application and verified that I am speaking with the correct person using two identifiers.  Location: Patient: home Provider: office   I discussed the limitations of evaluation and management by telemedicine and the availability of in person appointments. The patient expressed understanding and agreed to proceed.  History of Present Illness:    Observations/Objective:   Assessment and Plan:   Follow Up Instructions:    I discussed the assessment and treatment plan with the patient. The patient was provided an opportunity to ask questions and all were answered. The patient agreed with the plan and demonstrated an understanding of the instructions.   The patient was advised to call back or seek an in-person evaluation if the symptoms worsen or if the condition fails to improve as anticipated.  I provided 20 minutes of non-face-to-face time during this encounter.   Patient compliant with the Lexapro.  No obvious side effects.  Unfortunately not exercising.  States it definitely has helped.  Quite a bit of stress with work at the [employer].  Also stress within the family    Review of Systems No headache, no major weight loss or weight gain, no chest pain no back pain abdominal pain no change in bowel habits complete ROS otherwise negative     Objective:   Physical Exam  Virtual      Assessment & Plan:  Impression chronic depression.  Clinically stable.  Element of anxiety.  Medication overall has helped a lot.  Patient wishes to stay on for now.  Will maintain 1 years worth.  Exercise strongly encouraged

## 2019-06-21 ENCOUNTER — Encounter: Payer: Self-pay | Admitting: Adult Health

## 2019-06-21 ENCOUNTER — Ambulatory Visit: Payer: Managed Care, Other (non HMO) | Admitting: Adult Health

## 2019-06-21 ENCOUNTER — Other Ambulatory Visit: Payer: Self-pay

## 2019-06-21 VITALS — BP 118/72 | HR 78 | Temp 98.3°F | Resp 16 | Ht 62.0 in | Wt 145.0 lb

## 2019-06-21 DIAGNOSIS — Z008 Encounter for other general examination: Secondary | ICD-10-CM | POA: Diagnosis not present

## 2019-06-21 DIAGNOSIS — Z0189 Encounter for other specified special examinations: Secondary | ICD-10-CM

## 2019-06-21 DIAGNOSIS — R32 Unspecified urinary incontinence: Secondary | ICD-10-CM | POA: Insufficient documentation

## 2019-06-21 DIAGNOSIS — N812 Incomplete uterovaginal prolapse: Secondary | ICD-10-CM | POA: Insufficient documentation

## 2019-06-21 DIAGNOSIS — N6009 Solitary cyst of unspecified breast: Secondary | ICD-10-CM | POA: Insufficient documentation

## 2019-06-21 DIAGNOSIS — N926 Irregular menstruation, unspecified: Secondary | ICD-10-CM | POA: Insufficient documentation

## 2019-06-21 NOTE — Patient Instructions (Signed)
The Biometric exam is a brief physical with labs including glucose and cholesterol. This does not replace a full physical with a primary care provider, and additional recommended labs. This is an acute care clinic not for maintenance of chronic or long standing conditions.  ° °Provider also recommends if you do not have a primary care provider for patient to establish care promptly.You can choose any provider of your choice at any facility of your choice, the below information is  just a resource to aid in you finding a primary care provider for routine health maintenance.  ° °[facility]  PHYSICIAN/PROVIDER  REFERRAL LINE at 1-800-449- 8688  °[facility] to help assist with finding a primary care doctor.  ° °Helpful resources below of other primary care office's accepting new patients.  ° °• [facility]  °       [address]  °[place]. [place] [address] °[phone] ° °• [facility]  °  [address], [address] °[place], [place] [address] °[phone] ° °• [facility] °[address]  °[place], [place]  °[phone]  ° °• [facility]  °[address], [address] ° [place] [address] °[phone]  ° ° °Follow up with primary care as needed for chronic and maintenance health care- can be seen in this employee clinic for acute care.   ° °Health Maintenance, Female °Adopting a healthy lifestyle and getting preventive care are important in promoting health and wellness. Ask your health care provider about: °· The right schedule for you to have regular tests and exams. °· Things you can do on your own to prevent diseases and keep yourself healthy. °What should I know about diet, weight, and exercise? °Eat a healthy diet ° °· Eat a diet that includes plenty of vegetables, fruits, low-fat dairy products, and lean protein. °· Do not eat a lot of foods that are high in solid fats, added sugars, or  sodium. °Maintain a healthy weight °Body mass index (BMI) is used to identify weight problems. It estimates body fat based on height and weight. Your health care provider can help determine your BMI and help you achieve or maintain a healthy weight. °Get regular exercise °Get regular exercise. This is one of the most important things you can do for your health. Most adults should: °· Exercise for at least 150 minutes each week. The exercise should increase your heart rate and make you sweat (moderate-intensity exercise). °· Do strengthening exercises at least twice a week. This is in addition to the moderate-intensity exercise. °· Spend less time sitting. Even light physical activity can be beneficial. °Watch cholesterol and blood lipids °Have your blood tested for lipids and cholesterol at 48 years of age, then have this test every 5 years. °Have your cholesterol levels checked more often if: °· Your lipid or cholesterol levels are high. °· You are older than 48 years of age. °· You are at high risk for heart disease. °What should I know about cancer screening? °Depending on your health history and family history, you may need to have cancer screening at various ages. This may include screening for: °· Breast cancer. °· Cervical cancer. °· Colorectal cancer. °· Skin cancer. °· Lung cancer. °What should I know about heart disease, diabetes, and high blood pressure? °Blood pressure and heart disease °· High blood pressure causes heart disease and increases the risk of stroke. This is more likely to develop in people   who have high blood pressure readings, are of African descent, or are overweight. °· Have your blood pressure checked: °? Every 3-5 years if you are 18-39 years of age. °? Every year if you are 40 years old or older. °Diabetes °Have regular diabetes screenings. This checks your fasting blood sugar level. Have the screening done: °· Once every three years after age 40 if you are at a normal weight and have  a low risk for diabetes. °· More often and at a younger age if you are overweight or have a high risk for diabetes. °What should I know about preventing infection? °Hepatitis B °If you have a higher risk for hepatitis B, you should be screened for this virus. Talk with your health care provider to find out if you are at risk for hepatitis B infection. °Hepatitis C °Testing is recommended for: °· Everyone born from 1945 through 1965. °· Anyone with known risk factors for hepatitis C. °Sexually transmitted infections (STIs) °· Get screened for STIs, including gonorrhea and chlamydia, if: °? You are sexually active and are younger than 48 years of age. °? You are older than 48 years of age and your health care provider tells you that you are at risk for this type of infection. °? Your sexual activity has changed since you were last screened, and you are at increased risk for chlamydia or gonorrhea. Ask your health care provider if you are at risk. °· Ask your health care provider about whether you are at high risk for HIV. Your health care provider may recommend a prescription medicine to help prevent HIV infection. If you choose to take medicine to prevent HIV, you should first get tested for HIV. You should then be tested every 3 months for as long as you are taking the medicine. °Pregnancy °· If you are about to stop having your period (premenopausal) and you may become pregnant, seek counseling before you get pregnant. °· Take 400 to 800 micrograms (mcg) of folic acid every day if you become pregnant. °· Ask for birth control (contraception) if you want to prevent pregnancy. °Osteoporosis and menopause °Osteoporosis is a disease in which the bones lose minerals and strength with aging. This can result in bone fractures. If you are 65 years old or older, or if you are at risk for osteoporosis and fractures, ask your health care provider if you should: °· Be screened for bone loss. °· Take a calcium or vitamin D  supplement to lower your risk of fractures. °· Be given hormone replacement therapy (HRT) to treat symptoms of menopause. °Follow these instructions at home: °Lifestyle °· Do not use any products that contain nicotine or tobacco, such as cigarettes, e-cigarettes, and chewing tobacco. If you need help quitting, ask your health care provider. °· Do not use street drugs. °· Do not share needles. °· Ask your health care provider for help if you need support or information about quitting drugs. °Alcohol use °· Do not drink alcohol if: °? Your health care provider tells you not to drink. °? You are pregnant, may be pregnant, or are planning to become pregnant. °· If you drink alcohol: °? Limit how much you use to 0-1 drink a day. °? Limit intake if you are breastfeeding. °· Be aware of how much alcohol is in your drink. In the [place] one drink equals one 12 oz bottle of beer (355 mL), one 5 oz glass of wine (148 mL), or one 1½ oz glass of hard liquor (  44 mL). °General instructions °· Schedule regular health, dental, and eye exams. °· Stay current with your vaccines. °· Tell your health care provider if: °? You often feel depressed. °? You have ever been abused or do not feel safe at home. °Summary °· Adopting a healthy lifestyle and getting preventive care are important in promoting health and wellness. °· Follow your health care provider's instructions about healthy diet, exercising, and getting tested or screened for diseases. °· Follow your health care provider's instructions on monitoring your cholesterol and blood pressure. °This information is not intended to replace advice given to you by your health care provider. Make sure you discuss any questions you have with your health care provider. °Document Released: 05/27/2011 Document Revised: 11/04/2018 Document Reviewed: 11/04/2018 °Elsevier Patient Education © 2020 Elsevier Inc. ° °

## 2019-06-21 NOTE — Progress Notes (Addendum)
[facility] Wolfgang Phoenix, Grace Bushy, MD - primary care provider.   Baldwin DOB: 48 y.o. MRN: [id number]  Subjective:  Here for Biometric Screen/brief exam She is a 48 year old female in no cute distress who comes to the clinic for her biometric physical/ brief exam.  She denies any health concerns at this time.  She sees  Mikey Kirschner, MD for regular primary care and follow up.  She reports he is still taking Lexapro. Denies any suicidal or homicidal thoughts or intents.   Patient  denies any fever, body aches,chills, rash, chest pain, shortness of breath, nausea, vomiting, or diarrhea.    Objective:  Vitals:   06/21/19 1000  BP: 118/72  Pulse: 78  Resp: 16  Temp: 98.3 F (36.8 C)  SpO2: 100%   NAD Patient is alert and oriented and responsive to questions Engages in eye contact with provider. Speaks in full sentences without any pauses without any shortness of breath or distress. ' HEENT: Within normal limits Neck: Normal, supple, normal range of motion Heart: Regular rate and rhythm Lungs: Clear to auscultation without any adventitious lung sounds  Patient moves on and off of exam table and in room without difficulty. Gait is normal in hall and in room.  Assessment:  1. Encounter for other general examination- brief biometric physical    2. Encounter for biometric screening       Plan: Follow up with Mikey Kirschner, MD primary care  Fasting glucose and lipids. Discussed with patient that today's visit here is a limited biometric screening visit (not a comprehensive exam or management of any chronic problems) Discussed some health issues, including healthy eating habits and exercise. Encouraged to follow-up with PCP for annual comprehensive preventive and wellness care (and if applicable, any chronic issues). Questions invited and answered.  Patient would  like lab results released to her MyChart. Follow up with primary care as needed for chronic and maintenance health care- can be seen in this employee clinic for acute care.

## 2019-06-21 NOTE — Addendum Note (Signed)
Addended by: Judie Petit on: 06/21/2019 10:01 AM   Modules accepted: Orders

## 2019-06-22 ENCOUNTER — Encounter: Payer: Self-pay | Admitting: Adult Health

## 2019-06-22 LAB — LIPID PANEL WITH LDL/HDL RATIO
Cholesterol, Total: 208 mg/dL — ABNORMAL HIGH (ref 100–199)
HDL: 63 mg/dL (ref >39)
LDL Calculated: 111 mg/dL — ABNORMAL HIGH (ref 0–99)
LDl/HDL Ratio: 1.8 ratio (ref 0.0–3.2)
Triglycerides: 172 mg/dL — ABNORMAL HIGH (ref 0–149)
VLDL Cholesterol Cal: 34 mg/dL (ref 5–40)

## 2019-06-22 LAB — GLUCOSE, RANDOM: Glucose: 75 mg/dL (ref 65–99)

## 2019-06-22 NOTE — Progress Notes (Signed)
Renee Cordova,  Your glucose is normal. Your total cholesterol is 208 this is elevated normal range is 100-199.  Triglycerides are 172 elevated 0-149 is normal range. LDL your bad cholesterol is elevated at 111 normal is less than 99. Your HDL good cholesterol is protective and is great at 63 normal is over 39. I recommend you follow up with your primary care provider for recheck in 3 months or sooner after increased exercise, low triglycerides, decrease saturated fats and a heart health diet.

## 2019-11-10 ENCOUNTER — Telehealth: Payer: Self-pay | Admitting: Family Medicine

## 2019-11-10 MED ORDER — LORAZEPAM 1 MG PO TABS: ORAL_TABLET | ORAL | 1 refills | Status: DC

## 2019-11-10 NOTE — Telephone Encounter (Signed)
Script printed;awaiting signature. Left detailed message on voicemail

## 2019-11-10 NOTE — Telephone Encounter (Signed)
Lorazepam 1.0 mg numb 30 one half to one p o qhs prn sleep one ref

## 2019-11-10 NOTE — Telephone Encounter (Signed)
Please advise. Thank you

## 2019-11-10 NOTE — Telephone Encounter (Signed)
Pt's husband called - states pt's father's health is declining  & Hospice said his end of life is near Pt's husband states that the pt is having trouble sleeping & wonders if we can call something in?  Please advise & call pt    [place]

## 2019-12-02 ENCOUNTER — Ambulatory Visit: Payer: Managed Care, Other (non HMO) | Attending: Internal Medicine

## 2019-12-02 DIAGNOSIS — Z20822 Contact with and (suspected) exposure to covid-19: Secondary | ICD-10-CM

## 2019-12-04 LAB — NOVEL CORONAVIRUS, NAA: SARS-CoV-2, NAA: NOT DETECTED

## 2019-12-29 ENCOUNTER — Encounter: Payer: Self-pay | Admitting: Family Medicine

## 2020-01-10 ENCOUNTER — Other Ambulatory Visit: Payer: Self-pay

## 2020-01-10 ENCOUNTER — Encounter: Payer: Self-pay | Admitting: Obstetrics and Gynecology

## 2020-01-10 ENCOUNTER — Ambulatory Visit: Payer: Managed Care, Other (non HMO) | Admitting: Obstetrics and Gynecology

## 2020-01-10 VITALS — BP 122/70 | HR 76 | Temp 97.9°F | Resp 16 | Ht 62.0 in | Wt 157.2 lb

## 2020-01-10 DIAGNOSIS — Z01419 Encounter for gynecological examination (general) (routine) without abnormal findings: Secondary | ICD-10-CM

## 2020-01-10 NOTE — Progress Notes (Signed)
49 y.o. G91P1011 Married Caucasian female here for annual exam.    Had 2 cycles last month:  LMP 12-20-19 x3 days PMP 12-01-19 x6 days  Declines birth control due to hx of breast cysts while on COCs in the past.   Some leakage first am if bladder is full.  No leakage with a sneeze, laugh, or cough.   Father died with dementia in 2019-11-26.  Son in 6th grade.   PCP: Ardyth Gal, MD    Patient's last menstrual period was 12/20/2019 (exact date).           Sexually active: Yes.    The current method of family planning is vasectomy.    Exercising: No.  The patient does not participate in regular exercise at present. Smoker:  no  Health Maintenance: Pap: 12-26-17 Neg:Neg HR HPV, 09-09-14 Neg:Neg HR HPV, 06-12-12 Neg:Neg HR HPV  History of abnormal Pap:  Yes, Had colposcopy in her early 13s but doesn't think she had any treatment to cervix. MMG: 05-10-19 3D/Neg/density C/BiRads1 Colonoscopy: 2009 normal BMD:   n/a  Result  n/a TDaP:  2015 Gardasil:   no HIV: Neg in pregnancy Hep C: no Screening Labs:  Today.  Flu vaccine:  Completed.   reports that she has never smoked. She has never used smokeless tobacco. She reports current alcohol use of about 5.0 standard drinks of alcohol per week. She reports that she does not use drugs.  Past Medical History:  Diagnosis Date  . Abnormal Pap smear of cervix   . Hypertriglyceridemia     Past Surgical History:  Procedure Laterality Date  . BREAST CYST ASPIRATION Left   . cystoclel repair  2009    Current Outpatient Medications  Medication Sig Dispense Refill  . Calcium Carbonate-Vitamin D (CALCIUM + D PO) Take by mouth daily.    Marland Kitchen escitalopram (LEXAPRO) 10 MG tablet Take 1 tablet (10 mg total) by mouth daily. 30 tablet 11  . loratadine (CLARITIN) 10 MG tablet Take 1 tablet (10 mg total) by mouth daily. 30 tablet 11  . LORazepam (ATIVAN) 1 MG tablet Take 1/2-1 tablet po qhs prn sleep 30 tablet 1  . Multiple Vitamin  (MULTIVITAMIN) tablet Take 1 tablet by mouth daily.    . Omega-3 Fatty Acids (FISH OIL PO) Take by mouth.     No current facility-administered medications for this visit.    Family History  Problem Relation Age of Onset  . Hypertension Mother   . Arthritis Mother   . Anxiety disorder Mother   . Heart disease Father   . Colitis Father   . Dementia Father   . Breast cancer Paternal Aunt   . Cancer Maternal Grandmother        Ovarian    Review of Systems  Genitourinary: Positive for urgency.       Stress incontinence  All other systems reviewed and are negative.   Exam:   BP 122/70   Pulse 76   Temp 97.9 F (36.6 C) (Temporal)   Resp 16   Ht 5\' 2"  (1.575 m)   Wt 157 lb 3.2 oz (71.3 kg)   LMP 12/20/2019 (Exact Date)   BMI 28.75 kg/m     General appearance: alert, cooperative and appears stated age Head: normocephalic, without obvious abnormality, atraumatic Neck: no adenopathy, supple, symmetrical, trachea midline and thyroid normal to inspection and palpation Lungs: clear to auscultation bilaterally Breasts: normal appearance, no masses or tenderness, No nipple retraction or dimpling, No nipple discharge  or bleeding, No axillary adenopathy Heart: regular rate and rhythm Abdomen: soft, non-tender; no masses, no organomegaly Extremities: extremities normal, atraumatic, no cyanosis or edema Skin: skin color, texture, turgor normal. No rashes or lesions Lymph nodes: cervical, supraclavicular, and axillary nodes normal. Neurologic: grossly normal  Pelvic: External genitalia:  no lesions              No abnormal inguinal nodes palpated.              Urethra:  normal appearing urethra with no masses, tenderness or lesions              Bartholins and Skenes: normal                 Vagina: normal appearing vagina with normal color and discharge, no lesions              Cervix: no lesions              Pap taken: No. Bimanual Exam:  Uterus:  normal size, contour, position,  consistency, mobility, non-tender.  Able to do Kegel.              Adnexa: no mass, fullness, tenderness              Rectal exam: Yes.  .  Confirms.              Anus:  normal sphincter tone, no lesions  Chaperone was present for exam.  Assessment:   Well woman visit with normal exam. Remote hx of abnormal pap.  Recent irregular menses. Depression.  Hx cystocele repair. Mild stress incontinence.   Plan: Mammogram screening discussed. Self breast awareness reviewed. Pap and HR HPV as above.  New guidelines discussed.  Guidelines for Calcium, Vitamin D, regular exercise program including cardiovascular and weight bearing exercise. Reviewed Kegel's, weight loss, pelvic floor therapy, and midurethral sling for stress incontinence.  Routine labs.  Return if irregular cycles persist.  Will then do evaluation for polyps, fibroids, cysts, etc. Follow up annually and prn.

## 2020-01-10 NOTE — Patient Instructions (Signed)

## 2020-01-11 LAB — LIPID PANEL
Chol/HDL Ratio: 3.4 ratio (ref 0.0–4.4)
Cholesterol, Total: 201 mg/dL — ABNORMAL HIGH (ref 100–199)
HDL: 59 mg/dL (ref >39)
LDL Chol Calc (NIH): 105 mg/dL — ABNORMAL HIGH (ref 0–99)
Triglycerides: 214 mg/dL — ABNORMAL HIGH (ref 0–149)
VLDL Cholesterol Cal: 37 mg/dL (ref 5–40)

## 2020-01-11 LAB — VITAMIN D 25 HYDROXY (VIT D DEFICIENCY, FRACTURES): Vit D, 25-Hydroxy: 29.8 ng/mL — ABNORMAL LOW (ref 30.0–100.0)

## 2020-01-11 LAB — COMPREHENSIVE METABOLIC PANEL
ALT: 11 IU/L (ref 0–32)
AST: 15 IU/L (ref 0–40)
Albumin/Globulin Ratio: 1.8 (ref 1.2–2.2)
Albumin: 4.2 g/dL (ref 3.8–4.8)
Alkaline Phosphatase: 52 IU/L (ref 39–117)
BUN/Creatinine Ratio: 10 (ref 9–23)
BUN: 7 mg/dL (ref 6–24)
Bilirubin Total: <0.2 mg/dL (ref 0.0–1.2)
CO2: 25 mmol/L (ref 20–29)
Calcium: 9.1 mg/dL (ref 8.7–10.2)
Chloride: 104 mmol/L (ref 96–106)
Creatinine, Ser: 0.67 mg/dL (ref 0.57–1.00)
GFR calc Af Amer: 120 mL/min/{1.73_m2} (ref >59)
GFR calc non Af Amer: 104 mL/min/{1.73_m2} (ref >59)
Globulin, Total: 2.4 g/dL (ref 1.5–4.5)
Glucose: 88 mg/dL (ref 65–99)
Potassium: 4.2 mmol/L (ref 3.5–5.2)
Sodium: 141 mmol/L (ref 134–144)
Total Protein: 6.6 g/dL (ref 6.0–8.5)

## 2020-01-11 LAB — CBC
Hematocrit: 39.3 % (ref 34.0–46.6)
Hemoglobin: 13.3 g/dL (ref 11.1–15.9)
MCH: 30.9 pg (ref 26.6–33.0)
MCHC: 33.8 g/dL (ref 31.5–35.7)
MCV: 91 fL (ref 79–97)
Platelets: 315 10*3/uL (ref 150–450)
RBC: 4.3 x10E6/uL (ref 3.77–5.28)
RDW: 12.1 % (ref 11.7–15.4)
WBC: 7.8 10*3/uL (ref 3.4–10.8)

## 2020-01-24 ENCOUNTER — Other Ambulatory Visit: Payer: Self-pay | Admitting: Obstetrics and Gynecology

## 2020-01-24 NOTE — Telephone Encounter (Signed)
[facility] pharmacy requesting refill on birth control. [phone]

## 2020-01-25 ENCOUNTER — Telehealth: Payer: Self-pay | Admitting: Obstetrics and Gynecology

## 2020-01-25 MED ORDER — DESOGESTREL-ETHINYL ESTRADIOL 0.15-0.02/0.01 MG (21/5) PO TABS: 1.0000 | ORAL_TABLET | Freq: Every day | ORAL | 0 refills | Status: DC

## 2020-01-25 NOTE — Telephone Encounter (Signed)
Erroneous encounter

## 2020-01-25 NOTE — Telephone Encounter (Deleted)
Medication refill request: *** Last AEX:  *** Next AEX: *** Last MMG (if hormonal medication request): *** Refill authorized: ***  

## 2020-01-25 NOTE — Telephone Encounter (Signed)
Call to patient. Message given to patient as seen below from Dr. Edward Jolly. Patient verbalized understanding and will call with update on cycle once starting pills.   Encounter closed.

## 2020-01-25 NOTE — Telephone Encounter (Signed)
I will ok 3 packs of pills.  I will need to know how she is doing with her cycles when she returns to the pills.  If the irregular bleeding persists, she will need office evaluation.

## 2020-01-25 NOTE — Telephone Encounter (Signed)
Medication refill request: OCP Last AEX:  01-10-20 BS Next AEX: 01-15-21 Last MMG (if hormonal medication request): 05-10-2019 density C/BIRADS 1 negative  Refill authorized: Today, please advise.   Spoke with patient. Patient states she has changed her mind and would like to continue on OCP. States she started her period the day after her aex for the 3rd time in a month. Patient states she would like to restart. Pharmacy confirmed as [facility]. RN advised would send request to Dr. Edward Jolly. Patient agreeable.   Medication pended for #1, 2RF. Please refill if appropriate.

## 2020-06-14 ENCOUNTER — Other Ambulatory Visit: Payer: Self-pay | Admitting: Obstetrics and Gynecology

## 2020-06-14 DIAGNOSIS — N926 Irregular menstruation, unspecified: Secondary | ICD-10-CM

## 2020-06-14 NOTE — Telephone Encounter (Signed)
Detailed message left on mobile number for patient to return call to office to provide update after restarting OCP in March. Advised could ask to speak to [place].

## 2020-06-15 ENCOUNTER — Telehealth: Payer: Self-pay | Admitting: Family Medicine

## 2020-06-15 NOTE — Telephone Encounter (Signed)
Patient was around a person not knowing they had Covid until today tested positive. She states having no symptoms right now.She is wanting to know when she should get tested. Please advise

## 2020-06-15 NOTE — Telephone Encounter (Signed)
Would wait 2-3 days after contact to get tested unless having sympotms sooner.  Quarantine till the testing.  Dr. Ladona Ridgel

## 2020-06-16 NOTE — Telephone Encounter (Signed)
Pt notified script would be refilled until her physical. She requested script be sent to a different pharmacy and this change was made in the chart.   Advised she is due for mammogram - pt will call to schedule.   Julious Payer, CMA

## 2020-06-16 NOTE — Telephone Encounter (Signed)
I will refill OCPs until her next annual exam.   Please remind patient that her mammogram is due.

## 2020-06-16 NOTE — Telephone Encounter (Signed)
Original refill was initiated by Irving Burton. Per Dr Rica Records request patient was called to discuss how she was doing since starting on OCP. Message was left at that time and patient called back today to state that she is doing well and has tolerated the birth control well. Note forwarded to Dr Edward Jolly to acknowledge pt is happy with the medication.   Julious Payer, CMA

## 2020-06-20 ENCOUNTER — Other Ambulatory Visit: Payer: Self-pay | Admitting: Family Medicine

## 2020-06-26 ENCOUNTER — Telehealth: Payer: Self-pay | Admitting: *Deleted

## 2020-06-26 DIAGNOSIS — E559 Vitamin D deficiency, unspecified: Secondary | ICD-10-CM

## 2020-06-26 DIAGNOSIS — E1169 Type 2 diabetes mellitus with other specified complication: Secondary | ICD-10-CM

## 2020-06-26 NOTE — Telephone Encounter (Signed)
Lipid, metabolic 7, vitamin D Hyperlipidemia, vitamin D deficiency Do labs before follow-up visit with Dr. Ladona Ridgel

## 2020-06-27 NOTE — Telephone Encounter (Signed)
Blood work ordered in Epic. Patient notified. 

## 2020-06-28 ENCOUNTER — Telehealth: Payer: Self-pay | Admitting: Family Medicine

## 2020-06-28 MED ORDER — ESCITALOPRAM OXALATE 10 MG PO TABS: 10.0000 mg | ORAL_TABLET | Freq: Every day | ORAL | 1 refills | Status: DC

## 2020-06-28 NOTE — Telephone Encounter (Signed)
May have 30-day with 1 refill

## 2020-06-28 NOTE — Telephone Encounter (Signed)
Prescription sent electronically to pharmacy. Patient notified. 

## 2020-06-28 NOTE — Telephone Encounter (Signed)
Lexapro  (pharmacy gave 3 pills to hold her over)  [facility]  Patient has appt  September 20th

## 2020-08-10 LAB — BASIC METABOLIC PANEL
BUN/Creatinine Ratio: 10 (ref 9–23)
BUN: 8 mg/dL (ref 6–24)
CO2: 23 mmol/L (ref 20–29)
Calcium: 9.5 mg/dL (ref 8.7–10.2)
Chloride: 105 mmol/L (ref 96–106)
Creatinine, Ser: 0.79 mg/dL (ref 0.57–1.00)
GFR calc Af Amer: 102 mL/min/{1.73_m2} (ref >59)
GFR calc non Af Amer: 89 mL/min/{1.73_m2} (ref >59)
Glucose: 81 mg/dL (ref 65–99)
Potassium: 4.8 mmol/L (ref 3.5–5.2)
Sodium: 141 mmol/L (ref 134–144)

## 2020-08-10 LAB — LIPID PANEL
Chol/HDL Ratio: 3.8 ratio (ref 0.0–4.4)
Cholesterol, Total: 196 mg/dL (ref 100–199)
HDL: 52 mg/dL (ref >39)
LDL Chol Calc (NIH): 93 mg/dL (ref 0–99)
Triglycerides: 311 mg/dL — ABNORMAL HIGH (ref 0–149)
VLDL Cholesterol Cal: 51 mg/dL — ABNORMAL HIGH (ref 5–40)

## 2020-08-10 LAB — VITAMIN D 25 HYDROXY (VIT D DEFICIENCY, FRACTURES): Vit D, 25-Hydroxy: 39.3 ng/mL (ref 30.0–100.0)

## 2020-08-14 ENCOUNTER — Encounter: Payer: Self-pay | Admitting: Family Medicine

## 2020-08-14 ENCOUNTER — Ambulatory Visit: Payer: Managed Care, Other (non HMO) | Admitting: Family Medicine

## 2020-08-14 ENCOUNTER — Other Ambulatory Visit: Payer: Self-pay

## 2020-08-14 VITALS — BP 122/72 | HR 82 | Temp 97.7°F | Ht 61.5 in | Wt 153.8 lb

## 2020-08-14 DIAGNOSIS — F419 Anxiety disorder, unspecified: Secondary | ICD-10-CM | POA: Diagnosis not present

## 2020-08-14 DIAGNOSIS — E781 Pure hyperglyceridemia: Secondary | ICD-10-CM

## 2020-08-14 DIAGNOSIS — R635 Abnormal weight gain: Secondary | ICD-10-CM

## 2020-08-14 DIAGNOSIS — Z23 Encounter for immunization: Secondary | ICD-10-CM | POA: Diagnosis not present

## 2020-08-14 MED ORDER — LORATADINE 10 MG PO TABS: ORAL_TABLET | ORAL | 1 refills | Status: DC

## 2020-08-14 MED ORDER — ESCITALOPRAM OXALATE 10 MG PO TABS: 10.0000 mg | ORAL_TABLET | Freq: Every day | ORAL | 1 refills | Status: DC

## 2020-08-14 NOTE — Patient Instructions (Signed)
High Triglycerides Eating Plan Triglycerides are a type of fat in the blood. High levels of triglycerides can increase your risk of heart disease and stroke. If your triglyceride levels are high, choosing the right foods can help lower your triglycerides and keep your heart healthy. Work with your health care provider or a diet and nutrition specialist (dietitian) to develop an eating plan that is right for you. What are tips for following this plan? General guidelines   Lose weight, if you are overweight. For most people, losing 5-10 lbs (2-5 kg) helps lower triglyceride levels. A weight-loss plan may include. ? 30 minutes of exercise at least 5 days a week. ? Reducing the amount of calories, sugar, and fat you eat.  Eat a wide variety of fresh fruits, vegetables, and whole grains. These foods are high in fiber.  Eat foods that contain healthy fats, such as fatty fish, nuts, seeds, and olive oil.  Avoid foods that are high in added sugar, added salt (sodium), saturated fat, and trans fat.  Avoid low-fiber, refined carbohydrates such as white bread, crackers, noodles, and white rice.  Avoid foods with partially hydrogenated oils (trans fats), such as fried foods or stick margarine.  Limit alcohol intake to no more than 1 drink a day for nonpregnant women and 2 drinks a day for men. One drink equals 12 oz of beer, 5 oz of wine, or 1 oz of hard liquor. Your health care provider may recommend that you drink less depending on your overall health. Reading food labels  Check food labels for the amount of saturated fat. Choose foods with no or very little saturated fat.  Check food labels for the amount of trans fat. Choose foods with no trans fat.  Check food labels for the amount of cholesterol. Choose foods low in cholesterol. Ask your dietitian how much cholesterol you should have each day.  Check food labels for the amount of sodium. Choose foods with less than 140 milligrams (mg) per  serving. Shopping  Buy dairy products labeled as nonfat (skim) or low-fat (1%).  Avoid buying processed or prepackaged foods. These are often high in added sugar, sodium, and fat. Cooking  Choose healthy fats when cooking, such as olive oil or canola oil.  Cook foods using lower fat methods, such as baking, broiling, boiling, or grilling.  Make your own sauces, dressings, and marinades when possible, instead of buying them. Store-bought sauces, dressings, and marinades are often high in sodium and sugar. Meal planning  Eat more home-cooked food and less restaurant, buffet, and fast food.  Eat fatty fish at least 2 times each week. Examples of fatty fish include salmon, trout, mackerel, tuna, and herring.  If you eat whole eggs, do not eat more than 3 egg yolks per week. What foods are recommended? The items listed may not be a complete list. Talk with your dietitian about what dietary choices are best for you. Grains Whole wheat or whole grain breads, crackers, cereals, and pasta. Unsweetened oatmeal. Bulgur. Barley. Quinoa. Brown rice. Whole wheat flour tortillas. Vegetables Fresh or frozen vegetables. Low-sodium canned vegetables. Fruits All fresh, canned (in natural juice), or frozen fruits. Meats and other protein foods Skinless chicken or turkey. Ground chicken or turkey. Lean cuts of pork, trimmed of fat. Fish and seafood, especially salmon, trout, and herring. Egg whites. Dried beans, peas, or lentils. Unsalted nuts or seeds. Unsalted canned beans. Natural peanut or almond butter. Dairy Low-fat dairy products. Skim or low-fat (1%) milk. Reduced fat (  2%) and low-sodium cheese. Low-fat ricotta cheese. Low-fat cottage cheese. Plain, low-fat yogurt. Fats and oils Tub margarine without trans fats. Light or reduced-fat mayonnaise. Light or reduced-fat salad dressings. Avocado. Safflower, olive, sunflower, soybean, and canola oils. What foods are not recommended? The items listed  may not be a complete list. Talk with your dietitian about what dietary choices are best for you. Grains White bread. White (regular) pasta. White rice. Cornbread. Bagels. Pastries. Crackers that contain trans fat. Vegetables Creamed or fried vegetables. Vegetables in a cheese sauce. Fruits Sweetened dried fruit. Canned fruit in syrup. Fruit juice. Meats and other protein foods Fatty cuts of meat. Ribs. Chicken wings. Bacon. Sausage. Bologna. Salami. Chitterlings. Fatback. Hot dogs. Bratwurst. Packaged lunch meats. Dairy Whole or reduced-fat (2%) milk. Half-and-half. Cream cheese. Full-fat or sweetened yogurt. Full-fat cheese. Nondairy creamers. Whipped toppings. Processed cheese or cheese spreads. Cheese curds. Beverages Alcohol. Sweetened drinks, such as soda, lemonade, fruit drinks, or punches. Fats and oils Butter. Stick margarine. Lard. Shortening. Ghee. Bacon fat. Tropical oils, such as coconut, palm kernel, or palm oils. Sweets and desserts Corn syrup. Sugars. Honey. Molasses. Candy. Jam and jelly. Syrup. Sweetened cereals. Cookies. Pies. Cakes. Donuts. Muffins. Ice cream. Condiments Store-bought sauces, dressings, and marinades that are high in sugar, such as ketchup and barbecue sauce. Summary  High levels of triglycerides can increase the risk of heart disease and stroke. Choosing the right foods can help lower your triglycerides.  Eat plenty of fresh fruits, vegetables, and whole grains. Choose low-fat dairy and lean meats. Eat fatty fish at least twice a week.  Avoid processed and prepackaged foods with added sugar, sodium, saturated fat, and trans fat.  If you need suggestions or have questions about what types of food are good for you, talk with your health care provider or a dietitian. This information is not intended to replace advice given to you by your health care provider. Make sure you discuss any questions you have with your health care provider. Document Revised:  10/24/2017 Document Reviewed: 01/14/2017 Elsevier Patient Education  2020 Elsevier Inc.  

## 2020-08-14 NOTE — Progress Notes (Signed)
Patient ID: Renee Cordova, female    DOB: [birth year], 49 y.o.   MRN: [id number]   Chief Complaint  Patient presents with  . Establish Care  . Hyperlipidemia    elevated TGs   Subjective:    HPI Pt here to establish care and f/u on anxiety and labs. Pt has been having more headaches than usual.  Not been sick at all this year.  Seeing gyn- started on low dose bcp to help with night sweats and periods painful. Helping with symptoms.  Within the past few months, noticing some headaches.  Thinking stress and not drinking enough water. Had sharp stabbing pain behind eye once.  Some in back of head and temples. No vision changes. Depth perception was off on the severe headache. No nausea or light sensitivity. meds- none. occ has ibuprofen and caffeine and resolves. Got better after taking imitrex with the severe pain.  Took her husband's meds.  History of microscopic hemturia.  Seen by urology in past. Nothing extra needed.   Pt stating has decreased down to 1 beer 1 per week. 7 beers per week in past. Eating a lot of carbs. Gained 10-12 lbs in past year. Pt requesting nutritionist referral for increase in weight and working on dec triglycerides in her diet.   Medical History Kamela has a past medical history of Abnormal Pap smear of cervix and Hypertriglyceridemia.   Outpatient Encounter Medications as of 08/14/2020  Medication Sig  . Calcium Carbonate-Vitamin D (CALCIUM + D PO) Take by mouth daily.  Marland Kitchen escitalopram (LEXAPRO) 10 MG tablet Take 1 tablet (10 mg total) by mouth daily.  Marland Kitchen loratadine (HM LORATADINE) 10 MG tablet TAKE ONE TABLET (10MG  TOTAL) BY MOUTH DAILY  . Multiple Vitamin (MULTIVITAMIN) tablet Take 1 tablet by mouth daily.  . Omega-3 Fatty Acids (FISH OIL PO) Take by mouth.  VIORELE 0.15-0.02/0.01 MG (21/5) tablet TAKE 1 TABLET BY MOUTH EVERY DAY  . [DISCONTINUED] escitalopram (LEXAPRO) 10 MG tablet Take 1 tablet (10 mg total) by mouth daily.  .  [DISCONTINUED] HM LORATADINE 10 MG tablet TAKE ONE TABLET (10MG  TOTAL) BY MOUTH DAILY  . [DISCONTINUED] LORazepam (ATIVAN) 1 MG tablet Take 1/2-1 tablet po qhs prn sleep   No facility-administered encounter medications on file as of 08/14/2020.     Review of Systems  Constitutional: Negative for chills and fever.  HENT: Negative for congestion, rhinorrhea and sore throat.   Respiratory: Negative for cough, shortness of breath and wheezing.   Cardiovascular: Negative for chest pain and leg swelling.  Gastrointestinal: Negative for abdominal pain, diarrhea, nausea and vomiting.  Genitourinary: Negative for dysuria and frequency.  Musculoskeletal: Negative for arthralgias and back pain.  Skin: Negative for rash.  Neurological: Negative for dizziness, weakness and headaches.     Vitals BP 122/72   Pulse 82   Temp 97.7 F (36.5 C)   Ht 5' 1.5" (1.562 m)   Wt 153 lb 12.8 oz (69.8 kg)   SpO2 99%   BMI 28.59 kg/m   Objective:   Physical Exam Vitals and nursing note reviewed.  Constitutional:      Appearance: Normal appearance.  HENT:     Head: Normocephalic and atraumatic.     Nose: Nose normal.     Mouth/Throat:     Mouth: Mucous membranes are moist.     Pharynx: Oropharynx is clear.  Eyes:     Extraocular Movements: Extraocular movements intact.     Conjunctiva/sclera: Conjunctivae normal.  Pupils: Pupils are equal, round, and reactive to light.  Cardiovascular:     Rate and Rhythm: Normal rate and regular rhythm.     Pulses: Normal pulses.     Heart sounds: Normal heart sounds.  Pulmonary:     Effort: Pulmonary effort is normal.     Breath sounds: Normal breath sounds. No wheezing, rhonchi or rales.  Musculoskeletal:        General: Normal range of motion.     Right lower leg: No edema.     Left lower leg: No edema.  Skin:    General: Skin is warm and dry.     Findings: No lesion or rash.  Neurological:     General: No focal deficit present.     Mental  Status: She is alert and oriented to person, place, and time.  Psychiatric:        Mood and Affect: Mood normal.        Behavior: Behavior normal.      Assessment and Plan   1. Hypertriglyceridemia - Ambulatory referral to diabetic education  2. Need for vaccination - Flu Vaccine QUAD 6+ mos PF IM (Fluarix Quad PF)  3. Weight gain - Ambulatory referral to diabetic education  4. Anxiety     Hypertriglyceridemia- cont to watch diet and decrease alcohol intake. And increase in exercising.  Pt wanting referral to nutritionist.  Given.  Anxiety- controlled, cont lexapro.  Weight gain- discussed dec diet and alcohol and pt referred to nutritionist.  F/u 68mo recheck labs.

## 2020-08-18 ENCOUNTER — Encounter: Payer: Self-pay | Admitting: Family Medicine

## 2020-09-01 ENCOUNTER — Telehealth: Payer: Self-pay

## 2020-09-01 NOTE — Telephone Encounter (Signed)
Patient is wanting a prescription called in for a yeast infection.

## 2020-09-01 NOTE — Telephone Encounter (Signed)
Left message for pt to return call to triage RN. 

## 2020-09-05 NOTE — Telephone Encounter (Signed)
Left message for pt to return call to triage RN. 

## 2020-09-08 NOTE — Telephone Encounter (Signed)
Left detailed message for pt to seek UC or PCP since has been 1 week from original phone message. Pt to return call on Monday 10/18 if needs OV.  Encounter closed

## 2021-01-15 ENCOUNTER — Ambulatory Visit: Payer: Managed Care, Other (non HMO) | Admitting: Obstetrics and Gynecology

## 2021-01-18 ENCOUNTER — Ambulatory Visit: Payer: Managed Care, Other (non HMO) | Admitting: Obstetrics and Gynecology

## 2021-01-18 ENCOUNTER — Telehealth: Payer: Self-pay

## 2021-01-18 ENCOUNTER — Telehealth: Payer: Self-pay | Admitting: *Deleted

## 2021-01-18 ENCOUNTER — Other Ambulatory Visit: Payer: Self-pay

## 2021-01-18 ENCOUNTER — Ambulatory Visit (INDEPENDENT_AMBULATORY_CARE_PROVIDER_SITE_OTHER): Payer: BC Managed Care – PPO | Admitting: Nurse Practitioner

## 2021-01-18 ENCOUNTER — Encounter: Payer: Self-pay | Admitting: Nurse Practitioner

## 2021-01-18 VITALS — BP 112/68 | HR 76 | Resp 16 | Ht 62.5 in | Wt 163.0 lb

## 2021-01-18 DIAGNOSIS — Z01419 Encounter for gynecological examination (general) (routine) without abnormal findings: Secondary | ICD-10-CM

## 2021-01-18 DIAGNOSIS — N898 Other specified noninflammatory disorders of vagina: Secondary | ICD-10-CM

## 2021-01-18 DIAGNOSIS — N6310 Unspecified lump in the right breast, unspecified quadrant: Secondary | ICD-10-CM

## 2021-01-18 MED ORDER — ESTRADIOL 0.1 MG/GM VA CREA: TOPICAL_CREAM | VAGINAL | 6 refills | Status: DC

## 2021-01-18 MED ORDER — ESTRADIOL 0.1 MG/GM VA CREA: TOPICAL_CREAM | VAGINAL | 4 refills | Status: DC

## 2021-01-18 NOTE — Telephone Encounter (Signed)
Pharmacist called. She is trying to get insurance to cover patient's Estradiol Cream. She said it is ongoing issue with insurance companies since only a 42.5 gram tube is available.   She said the way you wrote it with 1 gram twice weekly would make the 42.5 gram tube be a 140 days supply and insurance will not cover it.  She said she did not know if you would want to change the directions to get it covered.  She said what some do is write "use one to two grams twice weekly or as directed by physician". She said that will allow up to four and get it to a 90 days supply. She said they could explain correct dose to patient.  I told her I would check with you.

## 2021-01-18 NOTE — Patient Instructions (Addendum)
Nice to meet you today.  Here is a summery of some of what we discussed:    Pap : HPV and pap testing due 2024  Mammogram: we will call you for diagnostic mammogram  Labs: with PCP  Medications: Estrace vaginal, twice weekly  Colonoscopy: to call to schedule in September  Health Maintenance for Postmenopausal Women Menopause is a normal process in which your ability to get pregnant comes to an end. This process happens slowly over many months or years, usually between the ages of 86 and 31. Menopause is complete when you have missed your menstrual periods for 12 months. It is important to talk with your health care provider about some of the most common conditions that affect women after menopause (postmenopausal women). These include heart disease, cancer, and bone loss (osteoporosis). Adopting a healthy lifestyle and getting preventive care can help to promote your health and wellness. The actions you take can also lower your chances of developing some of these common conditions. What should I know about menopause? During menopause, you may get a number of symptoms, such as:  Hot flashes. These can be moderate or severe.  Night sweats.  Decrease in sex drive.  Mood swings.  Headaches.  Tiredness.  Irritability.  Memory problems.  Insomnia. Choosing to treat or not to treat these symptoms is a decision that you make with your health care provider. Do I need hormone replacement therapy?  Hormone replacement therapy is effective in treating symptoms that are caused by menopause, such as hot flashes and night sweats.  Hormone replacement carries certain risks, especially as you become older. If you are thinking about using estrogen or estrogen with progestin, discuss the benefits and risks with your health care provider. What is my risk for heart disease and stroke? The risk of heart disease, heart attack, and stroke increases as you age. One of the causes may be a change in the  body's hormones during menopause. This can affect how your body uses dietary fats, triglycerides, and cholesterol. Heart attack and stroke are medical emergencies. There are many things that you can do to help prevent heart disease and stroke. Watch your blood pressure  High blood pressure causes heart disease and increases the risk of stroke. This is more likely to develop in people who have high blood pressure readings, are of African descent, or are overweight.  Have your blood pressure checked: ? Every 3-5 years if you are 79-50 years of age. ? Every year if you are 54 years old or older. Eat a healthy diet  Eat a diet that includes plenty of vegetables, fruits, low-fat dairy products, and lean protein.  Do not eat a lot of foods that are high in solid fats, added sugars, or sodium.   Get regular exercise Get regular exercise. This is one of the most important things you can do for your health. Most adults should:  Try to exercise for at least 150 minutes each week. The exercise should increase your heart rate and make you sweat (moderate-intensity exercise).  Try to do strengthening exercises at least twice each week. Do these in addition to the moderate-intensity exercise.  Spend less time sitting. Even light physical activity can be beneficial. Other tips  Work with your health care provider to achieve or maintain a healthy weight.  Do not use any products that contain nicotine or tobacco, such as cigarettes, e-cigarettes, and chewing tobacco. If you need help quitting, ask your health care provider.  Know your  numbers. Ask your health care provider to check your cholesterol and your blood sugar (glucose). Continue to have your blood tested as directed by your health care provider. Do I need screening for cancer? Depending on your health history and family history, you may need to have cancer screening at different stages of your life. This may include screening for:  Breast  cancer.  Cervical cancer.  Lung cancer.  Colorectal cancer. What is my risk for osteoporosis? After menopause, you may be at increased risk for osteoporosis. Osteoporosis is a condition in which bone destruction happens more quickly than new bone creation. To help prevent osteoporosis or the bone fractures that can happen because of osteoporosis, you may take the following actions:  If you are 48-42 years old, get at least 1,000 mg of calcium and at least 600 mg of vitamin D per day.  If you are older than age 80 but younger than age 67, get at least 1,200 mg of calcium and at least 600 mg of vitamin D per day.  If you are older than age 75, get at least 1,200 mg of calcium and at least 800 mg of vitamin D per day. Smoking and drinking excessive alcohol increase the risk of osteoporosis. Eat foods that are rich in calcium and vitamin D, and do weight-bearing exercises several times each week as directed by your health care provider. How does menopause affect my mental health? Depression may occur at any age, but it is more common as you become older. Common symptoms of depression include:  Low or sad mood.  Changes in sleep patterns.  Changes in appetite or eating patterns.  Feeling an overall lack of motivation or enjoyment of activities that you previously enjoyed.  Frequent crying spells. Talk with your health care provider if you think that you are experiencing depression. General instructions See your health care provider for regular wellness exams and vaccines. This may include:  Scheduling regular health, dental, and eye exams.  Getting and maintaining your vaccines. These include: ? Influenza vaccine. Get this vaccine each year before the flu season begins. ? Pneumonia vaccine. ? Shingles vaccine. ? Tetanus, diphtheria, and pertussis (Tdap) booster vaccine. Your health care provider may also recommend other immunizations. Tell your health care provider if you have ever  been abused or do not feel safe at home. Summary  Menopause is a normal process in which your ability to get pregnant comes to an end.  This condition causes hot flashes, night sweats, decreased interest in sex, mood swings, headaches, or lack of sleep.  Treatment for this condition may include hormone replacement therapy.  Take actions to keep yourself healthy, including exercising regularly, eating a healthy diet, watching your weight, and checking your blood pressure and blood sugar levels.  Get screened for cancer and depression. Make sure that you are up to date with all your vaccines. This information is not intended to replace advice given to you by your health care provider. Make sure you discuss any questions you have with your health care provider. Document Revised: 11/04/2018 Document Reviewed: 11/04/2018 Elsevier Patient Education  2021 [employer]   Genitourinary Syndrome of Menopause (GSM)  Genitourinary Syndrome of menopause is a term that describes the spectrum of changes caused by the lack of estrogen in menopause. Common Signs and Symptoms Include: Vaginal dryness, irritation/burning/itching. Abnormal discharge, vaginal pressure, pain with sex, decreased sexual arousal/desire, difficulty achieving orgasm, pain with urination, urgency, incontinence of urine, recurrent bladder infections, urethral prolapse and others.  Diagnosis can usually be made with history and pelvic exam. Other testing may be considered to rule out other potential abnormalities. Symptoms can be progressive and chronic. The goal of treatment is symptom relief.  There are several different approaches to improving symptoms:  Vaginal Moisturizers and lubricants: Glycerin Moisturizer (Replens), Silicone based products (Uberlube), water-based products (Restore Moisturizing Gel by [employer]), Hyaluronic Acid vaginal products (such as HYALO GYN), and natural oils such as Olive Oil, Almond Oil and  Coconut Oil (Since coconut oil comes in solid preparations, you can form them into suppositories and insert into the vagina as needed). These products are Over-the-Counter (OTC) at [employer] and retail stores and DO NOT contain hormones.  How to use vaginal and vulvar moisturizers . Many vaginal moisturizers come with an applicator. You will need fill the applicator with the moisturizer and then insert it carefully into your vagina. You can put lubricant on the tip of the applicator to make it easier to insert into your vagina. . You can also use vaginal moisturizers on your vulvar tissues, including your inner and outer labia (the folds of skin around your vagina). To put these moisturizers on your vulva, put a small amount (pea or grape size) of moisturizer on your finger. Then, massage the moisturizer into your vaginal opening and onto your labia. . If you recently finished cancer treatment, or are going through sudden menopause, you may need to use the moisturizers 3 to 5 times a week to relieve your symptoms. . Vaginal and vulvar moisturizers should be used before you go to bed, so the product can be fully absorbed.  Lifestyle modifications: smoking cessation, pelvic floor physical therapy, Kegel's exercises, vaginal dilators  Non-hormonal therapies: Lidocaine (topical anesthetic) to decrease sensitivity, Oral Ospemifeme (requires prescription), Laser therapy (Pros and Cons should be discussed with provider)  Hormones Therapies: For moderate to severe GSM, vaginal estrogen is considered the most effective treatment. It can be used in combination with vaginal moisturizers and lubricants. Estrogen is delivered in small doses in the vagina with various preparations available including creams, rings, and tablets. Estrogen therapy may be contraindicated with certain hormone sensitive cancers. Vaginal DHEA and Testosterone have shown effectiveness in some cases to help with relieving vaginal atrophy and  have shown mixed results with libido. Risks and benefits should be discussed prior to starting therapy.

## 2021-01-18 NOTE — Telephone Encounter (Signed)
-----   Message from Clarita Crane, NP sent at 01/18/2021  1:44 PM EST ----- Can you schedule Diagnostic mammogram (bilateral) and [place] of Right breast at the breast center. Breasts are bilaterally cystic in nature, more dominant cystic-like mass, firm and tender, Right breast 5 and 7 o'clock, 6 cm from areola Thank you, Tresa Endo

## 2021-01-18 NOTE — Telephone Encounter (Signed)
Thank you, I called pharmacist and fixed it

## 2021-01-18 NOTE — Progress Notes (Signed)
50 y.o. [id number] Married White or Caucasian female here for annual exam.      No menses since Sept 2021 Denies any further hot flashes or night sweats Stopped taking estrogen in July/ August 2021 Only concern is vaginal dryness, denies abnormal discharge.  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 (Menstrual status: Irregular Periods).          Sexually active: Yes.    The current method of family planning is vasectomy.    Exercising: No.  exercise Smoker:  no  Health Maintenance: Pap:  12-26-17 neg HPV HR neg History of abnormal Pap:  yes MMG:  05-10-2019 category c density birads 1:neg Colonoscopy:  2009 BMD:   none TDaP:  2015 Gardasil:   n/a Covid-19: moderna Hep C testing: not done Screening Labs:with PCP   reports that she has never smoked. She has never used smokeless tobacco. She reports current alcohol use of about 3.0 standard drinks of alcohol per week. She reports that she does not use drugs.  Past Medical History:  Diagnosis Date  . Abnormal Pap smear of cervix   . Hypertriglyceridemia     Past Surgical History:  Procedure Laterality Date  . BREAST CYST ASPIRATION Left   . cystoclel repair  2009    Current Outpatient Medications  Medication Sig Dispense Refill  . escitalopram (LEXAPRO) 10 MG tablet Take 1 tablet (10 mg total) by mouth daily. 90 tablet 1  . loratadine (HM LORATADINE) 10 MG tablet TAKE ONE TABLET (10MG  TOTAL) BY MOUTH DAILY 90 tablet 1  . Multiple Vitamin (MULTIVITAMIN) tablet Take 1 tablet by mouth daily.    . Omega-3 Fatty Acids (FISH OIL PO) Take by mouth.     No current facility-administered medications for this visit.    Family History  Problem Relation Age of Onset  . Hypertension Mother   . Arthritis Mother   . Anxiety disorder Mother   . Heart disease Father   . Colitis Father   . Dementia Father   . Breast cancer Paternal Aunt   . Cancer Maternal Grandmother        Ovarian    Review of Systems  Constitutional:  Negative.   HENT: Negative.   Eyes: Negative.   Respiratory: Negative.   Cardiovascular: Negative.   Gastrointestinal: Negative.   Endocrine: Negative.   Genitourinary:       Amenorrhea  Musculoskeletal: Negative.   Skin: Negative.   Allergic/Immunologic: Negative.   Neurological: Negative.   Hematological: Negative.   Psychiatric/Behavioral: Negative.     Exam:   BP 112/68   Pulse 76   Resp 16   Ht 5' 2.5" (1.588 m)   Wt 163 lb (73.9 kg)   BMI 29.34 kg/m   Height: 5' 2.5" (158.8 cm)  General appearance: alert, cooperative and appears stated age, no acute distress Head: Normocephalic, without obvious abnormality Neck: no adenopathy, thyroid normal to inspection and palpation Lungs: clear to auscultation bilaterally Breasts: No axillary or supraclavicular adenopathy, Right breast with cluster of cystic type masses 5 o'clock at outer border of breast Heart: regular rate and rhythm Abdomen: soft, non-tender; no masses,  no organomegaly Extremities: extremities normal, no edema Skin: No rashes or lesions Lymph nodes: Cervical, supraclavicular, and axillary nodes normal. No abnormal inguinal nodes palpated Neurologic: Grossly normal   Pelvic: External genitalia:  no lesions              Urethra:  normal appearing urethra with no masses, tenderness or lesions  Bartholins and Skenes: normal                 Vagina: normal appearing vagina, appropriate for age, normal appearing discharge, no lesions              Cervix: neg cervical motion tenderness, no visible lesions             Bimanual Exam:   Uterus:  normal size, contour, position, consistency, mobility, non-tender              Adnexa: no mass, fullness, tenderness                 Joy, CMA Chaperone was present for exam.  A:  Well Woman with normal exam  Breast with Cystic type mass R> L  P:   Pap : HPV and pap testing due 2024  Mammogram:  will schedule diagnostic bilateral, Right breast  Ultrasound  Labs: with PCP  Medications: Estrace vaginal, twice weekly  Colonoscopy: to call to schedule in September  F/u 1 year or sooner if needed

## 2021-01-24 NOTE — Telephone Encounter (Signed)
Patient informed with below.  

## 2021-01-24 NOTE — Telephone Encounter (Signed)
Left message for patient to call. My chart message unread

## 2021-02-13 ENCOUNTER — Ambulatory Visit: Payer: Managed Care, Other (non HMO) | Admitting: Family Medicine

## 2021-02-15 ENCOUNTER — Ambulatory Visit: Payer: BC Managed Care – PPO | Admitting: Family Medicine

## 2021-02-15 ENCOUNTER — Encounter: Payer: Self-pay | Admitting: Family Medicine

## 2021-02-15 ENCOUNTER — Other Ambulatory Visit: Payer: Self-pay

## 2021-02-15 VITALS — BP 122/74 | HR 75 | Temp 99.7°F | Ht 62.5 in | Wt 163.2 lb

## 2021-02-15 DIAGNOSIS — E785 Hyperlipidemia, unspecified: Secondary | ICD-10-CM

## 2021-02-15 DIAGNOSIS — F419 Anxiety disorder, unspecified: Secondary | ICD-10-CM | POA: Diagnosis not present

## 2021-02-15 DIAGNOSIS — R635 Abnormal weight gain: Secondary | ICD-10-CM | POA: Diagnosis not present

## 2021-02-15 MED ORDER — LORATADINE 10 MG PO TABS: ORAL_TABLET | ORAL | 1 refills | Status: AC

## 2021-02-15 MED ORDER — ESCITALOPRAM OXALATE 10 MG PO TABS: 10.0000 mg | ORAL_TABLET | Freq: Every day | ORAL | 1 refills | Status: DC

## 2021-02-15 NOTE — Progress Notes (Signed)
Patient ID: Renee Cordova, female    DOB: 09/29/1971, 50 y.o.   MRN: [id number]   Chief Complaint  Patient presents with  . Hyperlipidemia   Subjective:    HPI Pt here for 6 month follow up. Pt had lab work done 6 months ago that showed elevated triglycerides.  (pt had period a few weeks ago and that was her first one in a while; pt had advised OB)    Pt feeling that hasn't been good on the diet in the last 6 months.  Taking fish oil daily.  Seeing someone for dry needling and therapy for upper back pain.  Just started estradiol a few weeks ago. Seeing gyn for this.  lexapro- taking 14m daily.  Medical History [place] a past medical history of Abnormal Pap smear of cervix and Hypertriglyceridemia.   Outpatient Encounter Medications as of 02/15/2021  Medication Sig  . estradiol (ESTRACE) 0.1 MG/GM vaginal cream 1-2 grams twice weekly or as directed by provider  . Multiple Vitamin (MULTIVITAMIN) tablet Take 1 tablet by mouth daily.  . Omega-3 Fatty Acids (FISH OIL PO) Take by mouth.  . [DISCONTINUED] escitalopram (LEXAPRO) 10 MG tablet Take 1 tablet (10 mg total) by mouth daily.  . [DISCONTINUED] loratadine (HM LORATADINE) 10 MG tablet TAKE ONE TABLET (10MG TOTAL) BY MOUTH DAILY  . escitalopram (LEXAPRO) 10 MG tablet Take 1 tablet (10 mg total) by mouth daily.  .Marland Kitchenloratadine (HM LORATADINE) 10 MG tablet TAKE ONE TABLET (10MG TOTAL) BY MOUTH DAILY   No facility-administered encounter medications on file as of 02/15/2021.     Review of Systems  Constitutional: Negative for chills and fever.  HENT: Negative for congestion, rhinorrhea and sore throat.   Respiratory: Negative for cough, shortness of breath and wheezing.   Cardiovascular: Negative for chest pain and leg swelling.  Gastrointestinal: Negative for abdominal pain, diarrhea, nausea and vomiting.  Genitourinary: Negative for dysuria and frequency.  Musculoskeletal: Negative for arthralgias and back pain.   Skin: Negative for rash.  Neurological: Negative for dizziness, weakness and headaches.     Vitals BP 122/74   Pulse 75   Temp 99.7 F (37.6 C)   Ht 5' 2.5" (1.588 m)   Wt 163 lb 3.2 oz (74 kg)   SpO2 98%   BMI 29.37 kg/m   Objective:   Physical Exam Vitals and nursing note reviewed.  Constitutional:      Appearance: Normal appearance.  HENT:     Head: Normocephalic and atraumatic.     Nose: Nose normal.     Mouth/Throat:     Mouth: Mucous membranes are moist.     Pharynx: Oropharynx is clear.  Eyes:     Extraocular Movements: Extraocular movements intact.     Conjunctiva/sclera: Conjunctivae normal.     Pupils: Pupils are equal, round, and reactive to light.  Cardiovascular:     Rate and Rhythm: Normal rate and regular rhythm.     Pulses: Normal pulses.     Heart sounds: Normal heart sounds.  Pulmonary:     Effort: Pulmonary effort is normal.     Breath sounds: Normal breath sounds. No wheezing, rhonchi or rales.  Musculoskeletal:        General: Normal range of motion.     Right lower leg: No edema.     Left lower leg: No edema.  Skin:    General: Skin is warm and dry.     Findings: No lesion or rash.  Neurological:  General: No focal deficit present.     Mental Status: She is alert and oriented to person, place, and time.  Psychiatric:        Mood and Affect: Mood normal.        Behavior: Behavior normal.      Assessment and Plan   1. Hyperlipidemia, unspecified hyperlipidemia type - CBC - CMP14+EGFR - Lipid panel  2. Anxiety - escitalopram (LEXAPRO) 10 MG tablet; Take 1 tablet (10 mg total) by mouth daily.  Dispense: 90 tablet; Refill: 1  3. Weight gain - Ambulatory referral to Nutrition and Diabetic Education    hld- stable. Recheck labs. Last labs in 9/21- Trigl were elevated. Cont to dec carb in diet. Cont with omega 3.  Anxiety- cont with lexapro. Stable.  Weight gain- pt requesting referral.  F/u [place] prn.  Return in about  6 months (around 08/18/2021) for f/u hld.

## 2021-03-02 ENCOUNTER — Other Ambulatory Visit: Payer: Self-pay

## 2021-03-02 ENCOUNTER — Ambulatory Visit
Admission: RE | Admit: 2021-03-02 | Discharge: 2021-03-02 | Disposition: A | Discharge status: Home / Self Care | Payer: Managed Care, Other (non HMO) | Source: Ambulatory Visit | Attending: Nurse Practitioner | Admitting: Nurse Practitioner

## 2021-03-02 DIAGNOSIS — E785 Hyperlipidemia, unspecified: Secondary | ICD-10-CM | POA: Diagnosis not present

## 2021-03-02 DIAGNOSIS — N6310 Unspecified lump in the right breast, unspecified quadrant: Secondary | ICD-10-CM

## 2021-03-02 DIAGNOSIS — N644 Mastodynia: Secondary | ICD-10-CM | POA: Diagnosis not present

## 2021-03-03 LAB — CBC
Hematocrit: 38.1 % (ref 34.0–46.6)
Hemoglobin: 12.8 g/dL (ref 11.1–15.9)
MCH: 30.3 pg (ref 26.6–33.0)
MCHC: 33.6 g/dL (ref 31.5–35.7)
MCV: 90 fL (ref 79–97)
Platelets: 300 10*3/uL (ref 150–450)
RBC: 4.23 x10E6/uL (ref 3.77–5.28)
RDW: 12.3 % (ref 11.7–15.4)
WBC: 6.3 10*3/uL (ref 3.4–10.8)

## 2021-03-03 LAB — LIPID PANEL
Chol/HDL Ratio: 3.9 ratio (ref 0.0–4.4)
Cholesterol, Total: 201 mg/dL — ABNORMAL HIGH (ref 100–199)
HDL: 51 mg/dL (ref >39)
LDL Chol Calc (NIH): 115 mg/dL — ABNORMAL HIGH (ref 0–99)
Triglycerides: 201 mg/dL — ABNORMAL HIGH (ref 0–149)
VLDL Cholesterol Cal: 35 mg/dL (ref 5–40)

## 2021-03-03 LAB — CMP14+EGFR
ALT: 25 IU/L (ref 0–32)
AST: 22 IU/L (ref 0–40)
Albumin/Globulin Ratio: 1.9 (ref 1.2–2.2)
Albumin: 4.1 g/dL (ref 3.8–4.8)
Alkaline Phosphatase: 53 IU/L (ref 44–121)
BUN/Creatinine Ratio: 17 (ref 9–23)
BUN: 15 mg/dL (ref 6–24)
Bilirubin Total: 0.2 mg/dL (ref 0.0–1.2)
CO2: 23 mmol/L (ref 20–29)
Calcium: 9.6 mg/dL (ref 8.7–10.2)
Chloride: 104 mmol/L (ref 96–106)
Creatinine, Ser: 0.87 mg/dL (ref 0.57–1.00)
Globulin, Total: 2.2 g/dL (ref 1.5–4.5)
Glucose: 90 mg/dL (ref 65–99)
Potassium: 5.4 mmol/L — ABNORMAL HIGH (ref 3.5–5.2)
Sodium: 143 mmol/L (ref 134–144)
Total Protein: 6.3 g/dL (ref 6.0–8.5)
eGFR: 82 mL/min/{1.73_m2} (ref >59)

## 2021-03-08 ENCOUNTER — Telehealth: Payer: Self-pay

## 2021-03-08 NOTE — Telephone Encounter (Signed)
Pt received her lab results and recommendations, and verbalized understanding. She informs she declined referral to nutrition due to inability to support the expense.

## 2021-06-25 HISTORY — PX: UMBILICAL HERNIA REPAIR: SHX196

## 2021-06-25 HISTORY — PX: ABDOMINOPLASTY: SUR9

## 2021-08-28 ENCOUNTER — Other Ambulatory Visit: Payer: Self-pay | Admitting: Family Medicine

## 2021-08-28 DIAGNOSIS — F419 Anxiety disorder, unspecified: Secondary | ICD-10-CM

## 2021-10-15 NOTE — Telephone Encounter (Signed)
Sent my chart message 10/15/21 

## 2021-11-30 NOTE — Telephone Encounter (Signed)
Sent 2 message to schedule appointment 11/30/21

## 2021-12-06 NOTE — Telephone Encounter (Signed)
No response to my chart messages °

## 2022-01-09 ENCOUNTER — Encounter: Payer: Self-pay | Admitting: Obstetrics and Gynecology

## 2022-01-09 ENCOUNTER — Telehealth: Payer: Self-pay | Admitting: *Deleted

## 2022-01-09 NOTE — Telephone Encounter (Signed)
Dr.Silva I called patient.   Patient reports 3 weeks ago she had bleeding/cramping lasted about 6 days, changed tampons every 3 hours. Patient said yesterday bleeding/cramping returned during the day changing tampons every 3 hours, takes otc midol for cramping doesn't really help. Patient said at night bleeding is much heavier, reports she bled though pad. I explained to her when she saw Clarita Crane, NP in 2/22 she mentioned last cycles in 2021, patient said she can't remember honesty. She does remember having 6 months and no bleeding, but bleeding returned. She asked would a birth control pill be an option? Please advise

## 2022-01-09 NOTE — Telephone Encounter (Signed)
Error- encounter already created.

## 2022-01-11 IMAGING — US US BREAST*R* LIMITED INC AXILLA
2 series · 8 of 8 positions shown · non-contrast
Comparison: Previous exam(s).

CLINICAL DATA: Ordering physician describes palpable lumps within
the RIGHT breast at the 5 o'clock axis and 7 o'clock axis, 6 cm from
the nipple. Patient cannot localize the lumps today. Patient
describes intermittent breast pain. History of LEFT breast cysts.

EXAM:
DIGITAL DIAGNOSTIC BILATERAL MAMMOGRAM WITH TOMOSYNTHESIS AND CAD;
ULTRASOUND RIGHT BREAST LIMITED
TECHNIQUE: Bilateral digital diagnostic mammography and breast tomosynthesis
was performed. The images were evaluated with computer-aided
detection.; Targeted ultrasound examination of the right breast was
performed

[Series 1: us breast*right* limited inc axilla · 0.07mm/px · 7 of 7 slices shown (1 of 2)]
[im 1/7]
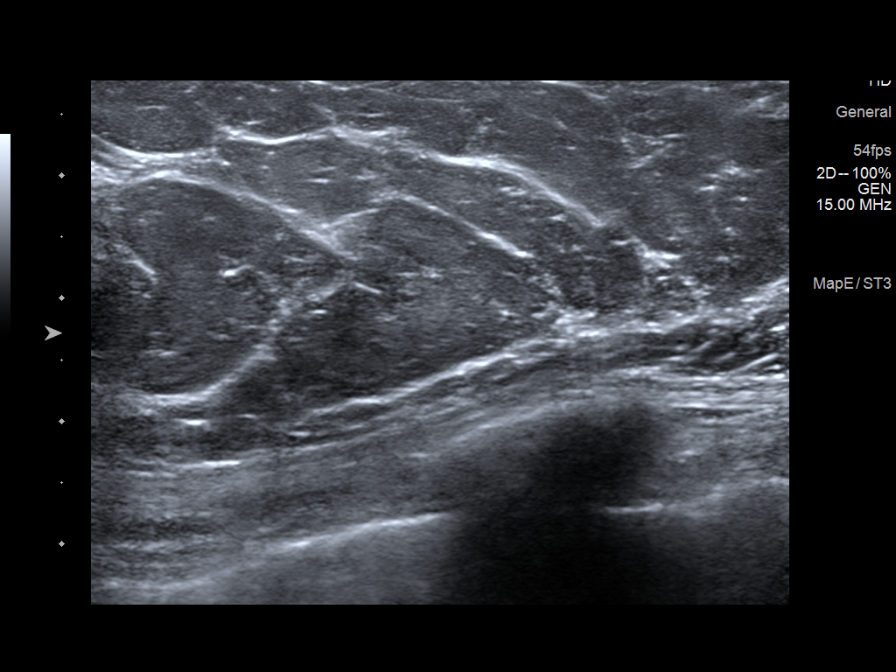
[im 2/7]
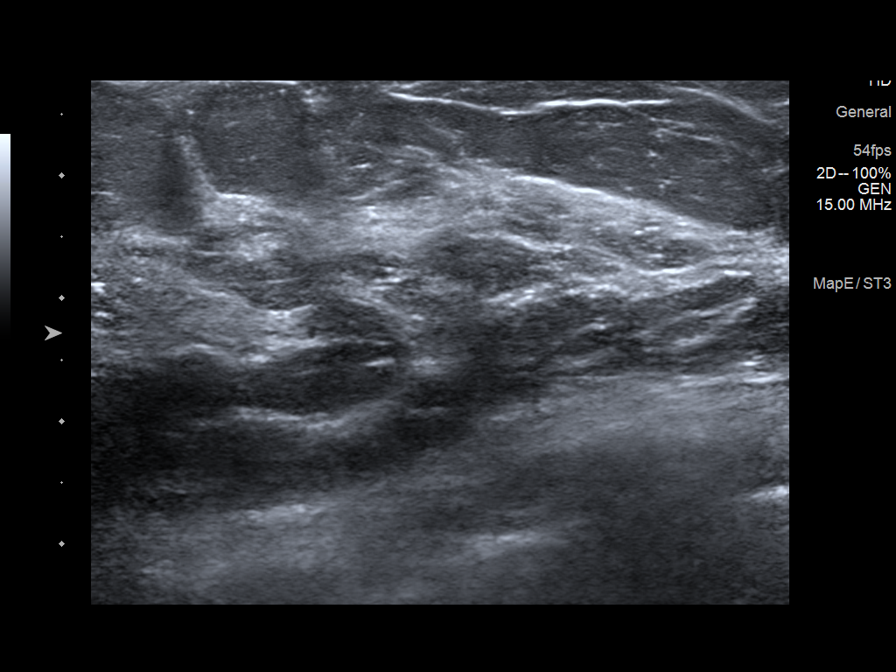
[im 3/7]
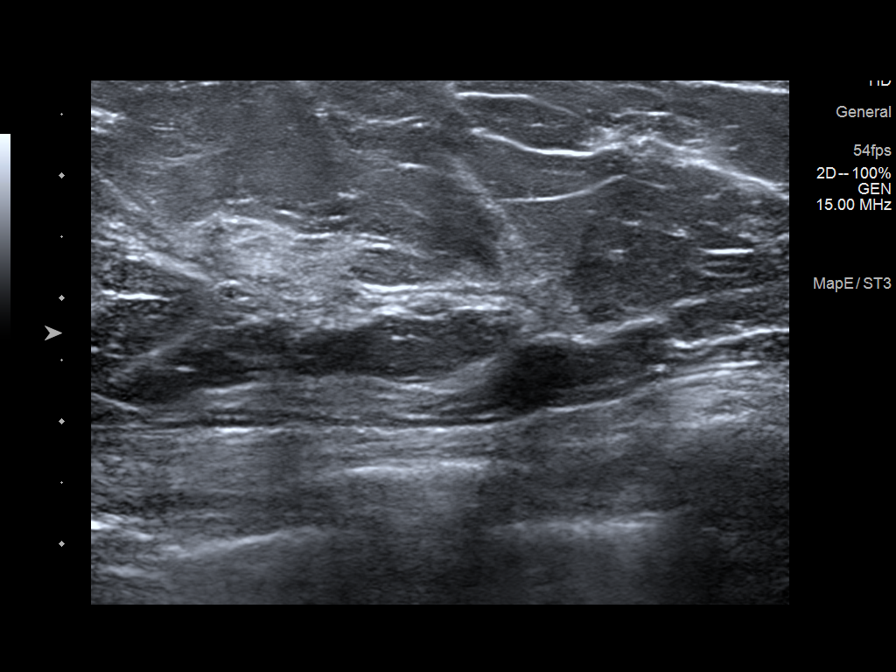
[im 4/7]
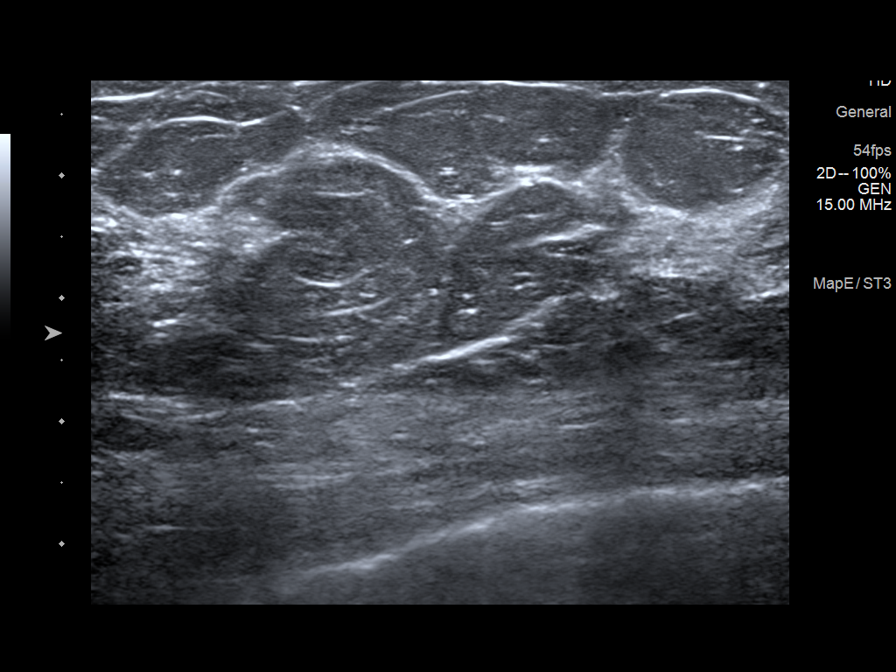
[im 5/7]
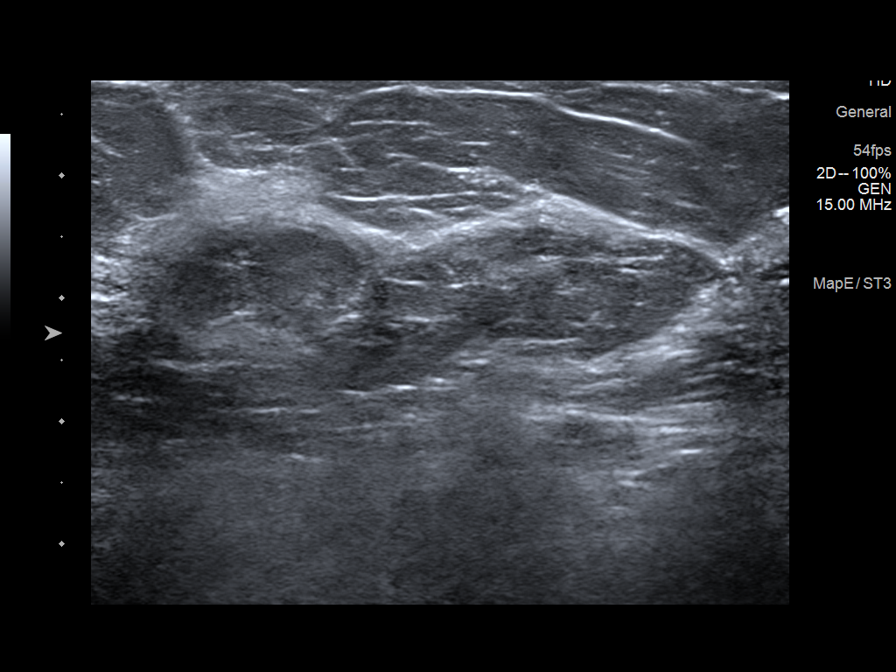
[im 6/7]
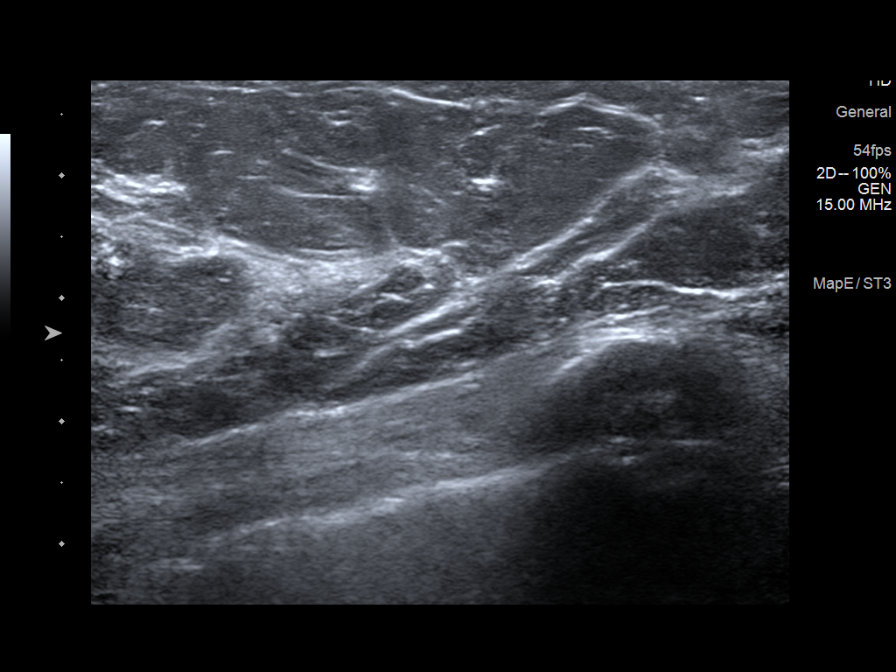
[im 7/7]
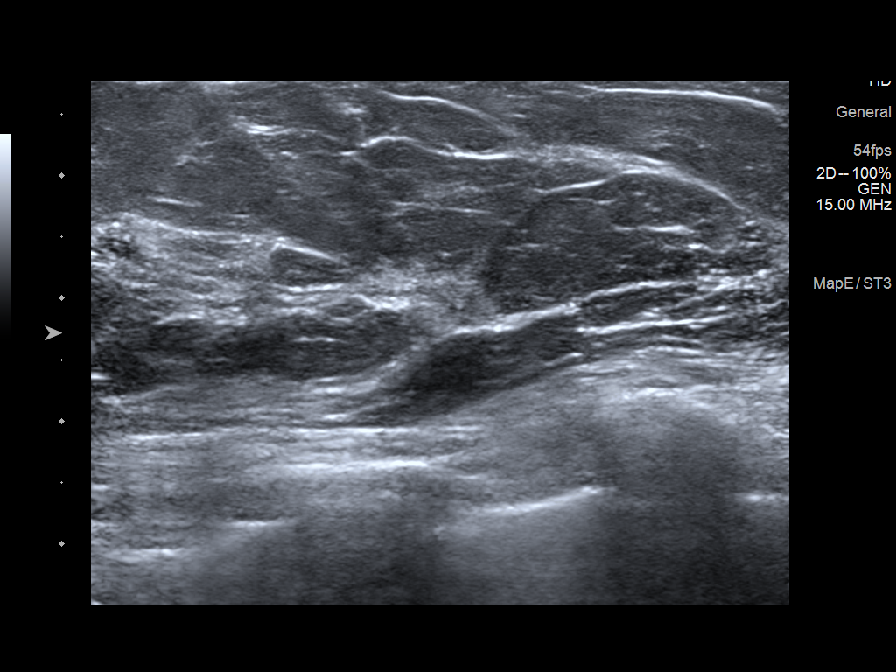

[Series 2: us breast*right* limited inc axilla · 0.07mm/px · 1 of 1 slices shown (2 of 2)]
[im 1/1]
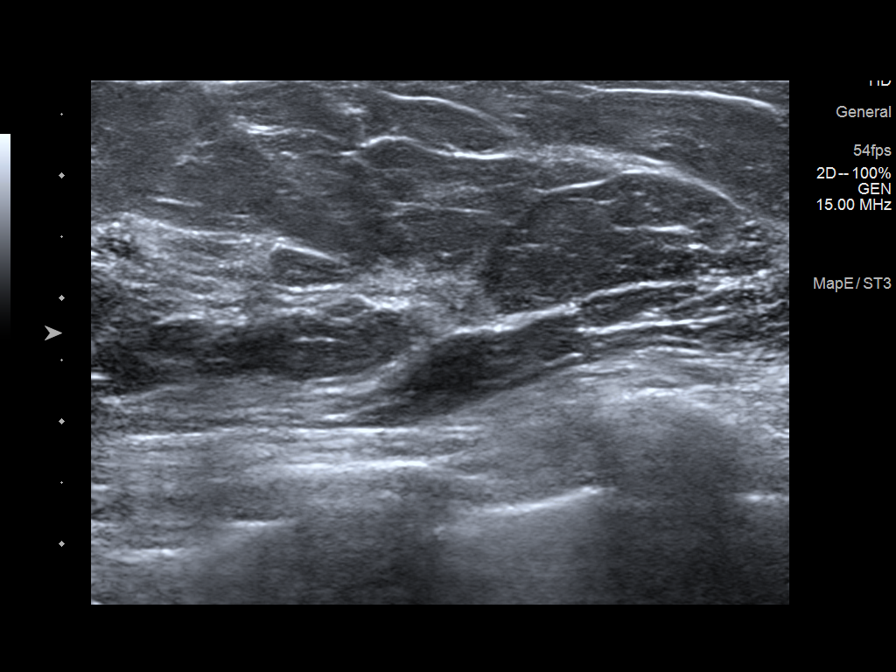

[8 of 8 positions shown; findings below may reference images not displayed]

ACR Breast Density Category c: The breast tissue is heterogeneously
dense, which may obscure small masses.
FINDINGS: Bilateral diagnostic mammogram: There are no new dominant masses,
suspicious calcifications or secondary signs of malignancy within
either breast. Specifically, there is no mammographic abnormality
within the lower RIGHT breast corresponding to the areas of clinical
concern.

Targeted ultrasound is performed, evaluating the lower RIGHT breast
from the 5 to 7 o'clock axes, showing only normal fibroglandular
tissues and fat lobules throughout. No solid or cystic mass.
IMPRESSION: No evidence of malignancy within either breast. Specifically, no
evidence of malignancy within the lower RIGHT breast corresponding
to the areas of clinical concern.

RECOMMENDATION:
Screening mammogram in one year.(Code:[id number])

The patient was instructed to return sooner if a new palpable
abnormality is identified in either breast.

I have discussed the findings and recommendations with the patient.
If applicable, a reminder letter will be sent to the patient
regarding the next appointment.

BI-RADS CATEGORY  1: Negative.

## 2022-01-17 ENCOUNTER — Ambulatory Visit: Payer: Self-pay

## 2022-01-17 ENCOUNTER — Encounter: Payer: Self-pay | Admitting: Physician Assistant

## 2022-01-17 ENCOUNTER — Ambulatory Visit (INDEPENDENT_AMBULATORY_CARE_PROVIDER_SITE_OTHER): Payer: Commercial Managed Care - PPO | Admitting: Physician Assistant

## 2022-01-17 DIAGNOSIS — M79644 Pain in right finger(s): Secondary | ICD-10-CM

## 2022-01-17 DIAGNOSIS — M1711 Unilateral primary osteoarthritis, right knee: Secondary | ICD-10-CM | POA: Diagnosis not present

## 2022-01-17 DIAGNOSIS — M1712 Unilateral primary osteoarthritis, left knee: Secondary | ICD-10-CM

## 2022-01-17 NOTE — Progress Notes (Signed)
Office Visit Note   Patient: Renee Cordova           Date of Birth: [birth year]           MRN: [id number] Visit Date: 01/17/2022              Requested by: Erven Colla, DO [place],  [place] [address] PCP: Erven Colla, DO   Assessment & Plan: Visit Diagnoses:  1. Primary osteoarthritis of right knee   2. Primary osteoarthritis of left knee   3. Thumb pain, right     Plan: In regards to her knees recommend that she work on quad strengthening particularly VMO muscles.  Exercises are shown.  Discussed knee friendly exercises with her.  Recommend Voltaren gel to both knees 4 g up to 4 times daily.  Right wrist pain she is tender over the [facility] joint and has a positive grind test most likely represents right CMC joint arthritic changes.  She did not want to take x-rays today as the thumb was not bothering her as much.  Therefore recommended Voltaren gel up to 2 g 4 times daily over the [facility] joint region.  She will follow-up with [place] on an as-needed basis.  If she continues to have thumb pain would recommend radiographs of the right thumb.  Questions were encouraged and answered at length follow-up as needed  Follow-Up Instructions: Return if symptoms worsen or fail to improve.   Orders:  Orders Placed This Encounter  Procedures   XR Knee 1-2 Views Right   XR Knee 1-2 Views Left   No orders of the defined types were placed in this encounter.     Procedures: No procedures performed   Clinical Data: No additional findings.   Subjective: Chief Complaint  Patient presents with   Right Knee - Pain   Left Knee - Pain    HPI Renee Cordova 51 year old female comes in today with bilateral knee pain and right wrist/thumb pain.  She has had bilateral knee pain for years.  Notes that deep squats and stairs bother her.  States her pain is 5 out of 10 at worst in both knees.  States pain is worse with cold weather wet weather.  No mechanical symptoms of either knee.  She  has had no treatment.  She has tried taking over-the-counter NSAIDs. Right thumb pain at the base of the thumb hard to open jars.  She has tried a brace that did not help.  She has pain at times but is not having significant pain in the thumb at this point time.  Review of Systems See HPI otherwise negative  Objective: Vital Signs: There were no vitals taken for this visit.  Physical Exam Constitutional:      Appearance: She is not ill-appearing or diaphoretic.  Pulmonary:     Effort: Pulmonary effort is normal.  Neurological:     Mental Status: She is alert and oriented to person, place, and time.  Psychiatric:        Mood and Affect: Mood normal.    Ortho Exam Bilateral knees good range of motion both knees.  Significant patellofemoral crepitus with passive range of motion both knees.  No instability valgus varus stressing of either knee.  No tenderness along medial lateral joint line of either knee. Right thumb she has tenderness at the [facility] joint.  Positive grind test.  Negative Finkelstein's test.  Nontender over the first extensor compartment of the right wrist.  There  is no bruising or erythema over the wrist or at the base of the thumb.  Specialty Comments:  No specialty comments available.  Imaging: XR Knee 1-2 Views Left  Result Date: 01/17/2022 Left knee 2 views: No acute fracture.  Mild to moderate narrowing with periarticular spurring off the medial compartment.  Lateral compartment well-preserved.  Mild to moderate patellofemoral changes.  Knee is well located.  XR Knee 1-2 Views Right  Result Date: 01/17/2022 Right knee 2 views: Knee is well located.  No acute findings.  Mild to moderate medial compartment and patellofemoral changes with periarticular spurring.    PMFS History: Patient Active Problem List   Diagnosis Date Noted   Cyst of breast 06/21/2019   Incomplete uterovaginal prolapse 06/21/2019   Irregular periods 06/21/2019   Urinary incontinence  06/21/2019   Depression, major, single episode, moderate ([place]) 10/03/2018   Microscopic hematuria 09/09/2014   Orthostatic hypotension 05/18/2014   Allergic rhinitis 03/15/2013   HEMORRHOIDS 06/28/2008   ANAL OR RECTAL PAIN 06/28/2008   VAGINAL LACERATION 06/28/2008   HEMATOCHEZIA, HX OF 06/28/2008   Past Medical History:  Diagnosis Date   Abnormal Pap smear of cervix    Hypertriglyceridemia     Family History  Problem Relation Age of Onset   Hypertension Mother    Arthritis Mother    Anxiety disorder Mother    Heart disease Father    Colitis Father    Dementia Father    Breast cancer Paternal Aunt    Cancer Maternal Grandmother        Ovarian    Past Surgical History:  Procedure Laterality Date   BREAST CYST ASPIRATION Left    cystoclel repair  2009   Social History   Occupational History   Not on file  Tobacco Use   Smoking status: Never   Smokeless tobacco: Never  Vaping Use   Vaping Use: Never used  Substance and Sexual Activity   Alcohol use: Yes    Alcohol/week: 3.0 standard drinks    Types: 3 Standard drinks or equivalent per week   Drug use: No   Sexual activity: Yes    Partners: Male    Comment: Husband had vasectomy

## 2022-01-21 ENCOUNTER — Ambulatory Visit (INDEPENDENT_AMBULATORY_CARE_PROVIDER_SITE_OTHER): Payer: Commercial Managed Care - PPO | Admitting: Obstetrics and Gynecology

## 2022-01-21 ENCOUNTER — Encounter: Payer: Self-pay | Admitting: Obstetrics and Gynecology

## 2022-01-21 ENCOUNTER — Ambulatory Visit: Payer: BC Managed Care – PPO | Admitting: Nurse Practitioner

## 2022-01-21 ENCOUNTER — Other Ambulatory Visit: Payer: Self-pay

## 2022-01-21 VITALS — BP 124/80 | HR 71 | Ht 61.5 in | Wt 165.0 lb

## 2022-01-21 DIAGNOSIS — Z01419 Encounter for gynecological examination (general) (routine) without abnormal findings: Secondary | ICD-10-CM

## 2022-01-21 DIAGNOSIS — N898 Other specified noninflammatory disorders of vagina: Secondary | ICD-10-CM | POA: Diagnosis not present

## 2022-01-21 MED ORDER — ESTRADIOL 0.1 MG/GM VA CREA: TOPICAL_CREAM | VAGINAL | 2 refills | Status: DC

## 2022-01-21 NOTE — Progress Notes (Signed)
51 y.o. G64P1011 Married Caucasian female here for annual exam.    Last 2 cycles were heavier than normal. Went for 6 months with no cycle last year, and then they restarted again.  LMP 12/18/21 or 12/19/21 and then 01/07/22.  Periods prior this were more regular.   Reporting vaginal dryness. Has estradiol vaginal cream, and using 1 - 2 times per week.  Harder to use tampons.   PCP:   [place]  Patient's last menstrual period was 01/07/2022 (exact date).     Period Pattern: (!) Irregular     Sexually active: Yes.    The current method of family planning is vasectomy.    Exercising: Yes.     Zumba and weights Smoker:  no  Health Maintenance: Pap:  12-26-17 Neg:Neg HR HPV, 09-09-14 Neg:neg HR HPV History of abnormal Pap:  Yes, Hx of cryotherapy age 65 MMG:  03-02-21 Diag.Bil.w/Rt.Br.US//Neg/BiRads1/screening 59yr Colonoscopy:  2009 benign. Done due to abd.pain.  She will schedule with provider at [facility].   BMD:   n/a  Result  n/a TDaP:  2015 Gardasil:   n/a HIV:no Hep C:no Screening Labs:   PCP.  Flu vaccine:  completed.  Covid booster:  completed bivalent.    reports that she has never smoked. She has never used smokeless tobacco. She reports that she does not currently use alcohol. She reports that she does not use drugs.  Past Medical History:  Diagnosis Date   Abnormal Pap smear of cervix    hx of cryotherapy to cervix age 15   Degenerative arthritis of knee, bilateral    Hypertriglyceridemia     Past Surgical History:  Procedure Laterality Date   ABDOMINOPLASTY  06/25/2021   BREAST CYST ASPIRATION Left    cystoclel repair  123XX123   UMBILICAL HERNIA REPAIR  06/25/2021    Current Outpatient Medications  Medication Sig Dispense Refill   Calcium-Magnesium-Vitamin D (CALCIUM 1200+D3 PO) Take by mouth.     escitalopram (LEXAPRO) 10 MG tablet Take 5 mg by mouth daily.     loratadine (HM LORATADINE) 10 MG tablet TAKE ONE TABLET (10MG  TOTAL) BY MOUTH DAILY 90  tablet 1   Multiple Vitamin (MULTIVITAMIN) tablet Take 1 tablet by mouth daily.     Omega-3 Fatty Acids (FISH OIL PO) Take by mouth.     estradiol (ESTRACE) 0.1 MG/GM vaginal cream 1 gram three times per week at hs or as directed by provider 42.5 g 2   No current facility-administered medications for this visit.    Family History  Problem Relation Age of Onset   Hypertension Mother    Arthritis Mother    Anxiety disorder Mother    Heart disease Father    Colitis Father    Dementia Father    Breast cancer Paternal Aunt    Cancer Maternal Grandmother        Ovarian    Review of Systems  Genitourinary:  Positive for menstrual problem (last 2 cycles heavier than normal).  All other systems reviewed and are negative.  Exam:   BP 124/80    Pulse 71    Ht 5' 1.5" (1.562 m)    Wt 165 lb (74.8 kg)    LMP 01/07/2022 (Exact Date)    SpO2 99%    BMI 30.67 kg/m     General appearance: alert, cooperative and appears stated age Head: normocephalic, without obvious abnormality, atraumatic Neck: no adenopathy, supple, symmetrical, trachea midline and thyroid normal to inspection and palpation Lungs: clear  to auscultation bilaterally Breasts: normal appearance, no masses or tenderness, No nipple retraction or dimpling, No nipple discharge or bleeding, No axillary adenopathy Heart: regular rate and rhythm Abdomen: soft, non-tender; no masses, no organomegaly Extremities: extremities normal, atraumatic, no cyanosis or edema Skin: skin color, texture, turgor normal. No rashes or lesions Lymph nodes: cervical, supraclavicular, and axillary nodes normal. Neurologic: grossly normal  Pelvic: External genitalia:  no lesions              No abnormal inguinal nodes palpated.              Urethra:  normal appearing urethra with no masses, tenderness or lesions              Bartholins and Skenes: normal                 Vagina: normal appearing vagina with normal color and discharge, no lesions.  First  degree rectocele.               Cervix: no lesions              Pap taken: no Bimanual Exam:  Uterus:  normal size, contour, position, consistency, mobility, non-tender              Adnexa: no mass, fullness, tenderness              Rectal exam: yes.  Confirms.              Anus:  normal sphincter tone, no lesions  Chaperone was present for exam:  Estill Bamberg, CMA  Assessment:   Well woman visit with gynecologic exam. Remote hx of abnormal pap.  Recent irregular menses. Depression.  On reduced Lexapro.  Hx cystocele repair.  Plan: Mammogram screening discussed.  She is due in April, 2023.  Self breast awareness reviewed. Pap and HR HPV 2024.  Guidelines for Calcium, Vitamin D, regular exercise program including cardiovascular and weight bearing exercise. Rx for vaginal estradiol 1 gram pv at hs three times per week.  Call for difficulty controlling bladder or bowel function.  Follow up annually and prn.   After visit summary provided.

## 2022-01-21 NOTE — Patient Instructions (Signed)

## 2022-04-28 ENCOUNTER — Ambulatory Visit
Admission: EM | Admit: 2022-04-28 | Discharge: 2022-04-28 | Disposition: A | Discharge status: Home / Self Care | Payer: Commercial Managed Care - PPO | Attending: Family Medicine | Admitting: Family Medicine

## 2022-04-28 ENCOUNTER — Ambulatory Visit (INDEPENDENT_AMBULATORY_CARE_PROVIDER_SITE_OTHER): Payer: Commercial Managed Care - PPO

## 2022-04-28 DIAGNOSIS — S2242XA Multiple fractures of ribs, left side, initial encounter for closed fracture: Secondary | ICD-10-CM | POA: Diagnosis not present

## 2022-04-28 MED ORDER — CYCLOBENZAPRINE HCL 10 MG PO TABS: 10.0000 mg | ORAL_TABLET | Freq: Every evening | ORAL | 0 refills | Status: DC | PRN

## 2022-04-28 MED ORDER — LIDOCAINE 5 % EX PTCH: 1.0000 | MEDICATED_PATCH | CUTANEOUS | 0 refills | Status: DC

## 2022-04-28 NOTE — ED Triage Notes (Signed)
Pt states she fell on Apr 17, 2022 and she fell on her left breast  Pt states that last night she felt like something shifted in her left breast muscle  Pt states that it hurts to lay on her left side

## 2022-04-28 NOTE — ED Provider Notes (Signed)
[facility]    CSN: [id number] Arrival date & time: 04/28/22  0809      History   Chief Complaint Chief Complaint  Patient presents with   Chest Pain    Chest pain from fall x2 weeks ago    HPI Renee Cordova is a 51 y.o. female.   Presenting today with ongoing left-sided rib pain anteriorly and bruising over the left breast after a fall 3 weeks ago directly onto this area.  She states the pain has been moderate to significant since onset does not seem to improve the way that she would have expected over time.  Has been trying Aleve at bedtime with mild temporary relief.  Denies chest pain, shortness of breath, decreased range of motion, loss of consciousness or head injury during fall.   Past Medical History:  Diagnosis Date   Abnormal Pap smear of cervix    hx of cryotherapy to cervix age 23   Degenerative arthritis of knee, bilateral    Hypertriglyceridemia     Patient Active Problem List   Diagnosis Date Noted   Cyst of breast 06/21/2019   Incomplete uterovaginal prolapse 06/21/2019   Irregular periods 06/21/2019   Urinary incontinence 06/21/2019   Depression, major, single episode, moderate ([place]) 10/03/2018   Microscopic hematuria 09/09/2014   Orthostatic hypotension 05/18/2014   Allergic rhinitis 03/15/2013   HEMORRHOIDS 06/28/2008   ANAL OR RECTAL PAIN 06/28/2008   VAGINAL LACERATION 06/28/2008   HEMATOCHEZIA, HX OF 06/28/2008    Past Surgical History:  Procedure Laterality Date   ABDOMINOPLASTY  06/25/2021   BREAST CYST ASPIRATION Left    cystoclel repair  123XX123   UMBILICAL HERNIA REPAIR  06/25/2021    OB History     Gravida  2   Para  1   Term  1   Preterm      AB  1   Living  1      SAB      IAB      Ectopic      Multiple      Live Births               Home Medications    Prior to Admission medications   Medication Sig Start Date End Date Taking? Authorizing Provider  cyclobenzaprine (FLEXERIL) 10 MG  tablet Take 1 tablet (10 mg total) by mouth at bedtime as needed for muscle spasms. Do not drink alcohol or drive while taking this medication.  May cause drowsiness. 04/28/22  Yes Volney American, PA-C  lidocaine (LIDODERM) 5 % Place 1 patch onto the skin daily. Remove & Discard patch within 12 hours or as directed by MD 04/28/22  Yes Volney American, PA-C  Calcium-Magnesium-Vitamin D (CALCIUM 1200+D3 PO) Take by mouth.    [provider]  escitalopram (LEXAPRO) 10 MG tablet Take 5 mg by mouth daily. 02/15/21   [provider]  estradiol (ESTRACE) 0.1 MG/GM vaginal cream 1 gram three times per week at hs or as directed by provider 01/21/22   Yisroel Ramming, Everardo All, MD  loratadine (HM LORATADINE) 10 MG tablet TAKE ONE TABLET (10MG  TOTAL) BY MOUTH DAILY 02/15/21   Elvia Collum M, DO  Multiple Vitamin (MULTIVITAMIN) tablet Take 1 tablet by mouth daily.    [provider]  Omega-3 Fatty Acids (FISH OIL PO) Take by mouth.    [provider]    Family History Family History  Problem Relation Age of Onset  Hypertension Mother    Arthritis Mother    Anxiety disorder Mother    Heart disease Father    Colitis Father    Dementia Father    Breast cancer Paternal Aunt    Cancer Maternal Grandmother        Ovarian    Social History Social History   Tobacco Use   Smoking status: Never   Smokeless tobacco: Never  Vaping Use   Vaping Use: Never used  Substance Use Topics   Alcohol use: Not Currently   Drug use: No     Allergies   Patient has no known allergies.   Review of Systems Review of Systems Per HPI  Physical Exam Triage Vital Signs ED Triage Vitals  Enc Vitals Group     BP 04/28/22 0828 119/82     Pulse Rate 04/28/22 0828 83     Resp 04/28/22 0828 18     Temp 04/28/22 0828 97.9 F (36.6 C)     Temp Source 04/28/22 0828 Oral     SpO2 04/28/22 0828 98 %     Weight --      Height --      Head Circumference --      Peak  Flow --      Pain Score 04/28/22 0825 4     Pain Loc --      Pain Edu? --      Excl. in [place]? --    No data found.  Updated Vital Signs BP 119/82 (BP Location: Right Arm)   Pulse 83   Temp 97.9 F (36.6 C) (Oral)   Resp 18   SpO2 98%   Visual Acuity Right Eye Distance:   Left Eye Distance:   Bilateral Distance:    Right Eye Near:   Left Eye Near:    Bilateral Near:     Physical Exam Vitals and nursing note reviewed.  Constitutional:      Appearance: Normal appearance. She is not ill-appearing.  HENT:     Head: Atraumatic.     Mouth/Throat:     Mouth: Mucous membranes are moist.     Pharynx: Oropharynx is clear.  Eyes:     Extraocular Movements: Extraocular movements intact.     Conjunctiva/sclera: Conjunctivae normal.  Cardiovascular:     Rate and Rhythm: Normal rate and regular rhythm.     Heart sounds: Normal heart sounds.  Pulmonary:     Effort: Pulmonary effort is normal. No respiratory distress.     Breath sounds: Normal breath sounds. No wheezing or rales.     Comments: Chest rise symmetric bilaterally Musculoskeletal:        General: Tenderness and signs of injury present. No swelling or deformity. Normal range of motion.     Cervical back: Normal range of motion and neck supple.     Comments: Significant tenderness to palpation over left lower anterior ribs just below breast.  No palpable bony deformity  Skin:    General: Skin is warm and dry.     Findings: Bruising present.     Comments: Bruising over lateral left breast  Neurological:     Mental Status: She is alert and oriented to person, place, and time.     Motor: No weakness.     Gait: Gait normal.  Psychiatric:        Mood and Affect: Mood normal.        Thought Content: Thought content normal.        Judgment:  Judgment normal.     UC Treatments / Results  Labs (all labs ordered are listed, but only abnormal results are displayed) Labs Reviewed - No data to  display  EKG   Radiology DG Ribs Unilateral W/Chest Left  Result Date: 04/28/2022 CLINICAL DATA:  Fall 3 weeks ago.  Left rib and chest wall pain. EXAM: LEFT RIBS AND CHEST - 3+ VIEW COMPARISON:  None Available. FINDINGS: Mildly displaced fractures are seen involving the left anterolateral 7th and 8th ribs. There is no evidence of pneumothorax or pleural effusion. Both lungs are clear. Heart size and mediastinal contours are within normal limits. IMPRESSION: Mildly displaced fractures involving the left anterolateral 7th and 8th ribs. No active cardiopulmonary disease. Electronically Signed   By: Marlaine Hind M.D.   On: 04/28/2022 08:54    Procedures Procedures (including critical care time)  Medications Ordered in UC Medications - No data to display  Initial Impression / Assessment and Plan / UC Course  I have reviewed the triage vital signs and the nursing notes.  Pertinent labs & imaging results that were available during my care of the patient were reviewed by me and considered in my medical decision making (see chart for details).     X-ray of the left ribs showing mildly displaced fractures of the seventh and eighth ribs anteriorly.  Treat with lidocaine patches, Flexeril at bedtime, over-the-counter pain relievers, heat, follow-up with PCP or orthopedics for recheck.  Final Clinical Impressions(s) / UC Diagnoses   Final diagnoses:  Closed fracture of multiple ribs of left side, initial encounter   Discharge Instructions   None    ED Prescriptions     Medication Sig Dispense Auth. Provider   lidocaine (LIDODERM) 5 % Place 1 patch onto the skin daily. Remove & Discard patch within 12 hours or as directed by MD 30 patch Volney American, PA-C   cyclobenzaprine (FLEXERIL) 10 MG tablet Take 1 tablet (10 mg total) by mouth at bedtime as needed for muscle spasms. Do not drink alcohol or drive while taking this medication.  May cause drowsiness. 10 tablet Volney American, [place]      PDMP not reviewed this encounter.   Volney American, [place] 04/28/22 1007

## 2022-04-29 ENCOUNTER — Telehealth: Payer: Self-pay | Admitting: Orthopedic Surgery

## 2022-04-29 NOTE — Telephone Encounter (Signed)
Call received from patient following Urgent care in [place] - states had Xrays and visit for problem of fractured ribs. Discussed office protocol regarding either primary care or pulmonary specialist as ortho does not treat this type of fracture; said will do.

## 2022-05-15 ENCOUNTER — Institutional Professional Consult (permissible substitution): Payer: Commercial Managed Care - PPO | Admitting: Internal Medicine

## 2022-05-15 ENCOUNTER — Telehealth: Payer: Self-pay | Admitting: Internal Medicine

## 2022-05-15 NOTE — Telephone Encounter (Signed)
I left a message for the patient to call back and re-schedule.

## 2022-05-24 ENCOUNTER — Encounter: Payer: Self-pay | Admitting: Family Medicine

## 2022-05-24 ENCOUNTER — Ambulatory Visit (INDEPENDENT_AMBULATORY_CARE_PROVIDER_SITE_OTHER): Payer: Commercial Managed Care - PPO | Admitting: Family Medicine

## 2022-05-24 VITALS — BP 128/89 | HR 72 | Temp 98.2°F | Ht 61.5 in | Wt 161.0 lb

## 2022-05-24 DIAGNOSIS — N898 Other specified noninflammatory disorders of vagina: Secondary | ICD-10-CM | POA: Diagnosis not present

## 2022-05-24 DIAGNOSIS — Z1211 Encounter for screening for malignant neoplasm of colon: Secondary | ICD-10-CM

## 2022-05-24 DIAGNOSIS — F321 Major depressive disorder, single episode, moderate: Secondary | ICD-10-CM

## 2022-05-24 MED ORDER — ESTRADIOL 0.1 MG/GM VA CREA: TOPICAL_CREAM | VAGINAL | 2 refills | Status: DC

## 2022-05-24 NOTE — Assessment & Plan Note (Signed)
Stable on Lexapro.  Continue. 

## 2022-05-24 NOTE — Progress Notes (Signed)
Subjective:  Patient ID: Renee Cordova, female    DOB: 02-07-1971  Age: 51 y.o. MRN: [id number]  CC: Chief Complaint  Patient presents with   Establish Care    Former pt of Dr. Ladona Ridgel. Urgent Care visit on 04/28/22 for fractured ribs. Pt currently on 10 mg lexapro but is ok decreasing dose.     HPI:  51 year old female with allergic rhinitis, depression presents to establish care.  Patient states that she is doing well on Estrace.  Is in need of refill.  Mood is stable on Lexapro.  Patient is doing well at this time.  She is due for colonoscopy.  She is also overdue for her mammogram.  She states that she wanted to wait until her recent rib injury healed.  Patient's Pap smear is up-to-date.  Patient Active Problem List   Diagnosis Date Noted   Vaginal dryness 05/24/2022   Encounter for screening colonoscopy 05/24/2022   Depression, major, single episode, moderate ([facility]) 10/03/2018   Allergic rhinitis 03/15/2013    Social Hx   Social History   Socioeconomic History   Marital status: Married    Spouse name: Not on file   Number of children: Not on file   Years of education: Not on file   Highest education level: Not on file  Occupational History   Not on file  Tobacco Use   Smoking status: Never   Smokeless tobacco: Never  Vaping Use   Vaping Use: Never used  Substance and Sexual Activity   Alcohol use: Not Currently   Drug use: No   Sexual activity: Yes    Partners: Male    Comment: Husband had vasectomy   Other Topics Concern   Not on file  Social History Narrative   Not on file   Social Determinants of Health   Financial Resource Strain: Not on file  Food Insecurity: Not on file  Transportation Needs: Not on file  Physical Activity: Not on file  Stress: Not on file  Social Connections: Not on file    Review of Systems Per HPI  Objective:  BP 128/89   Pulse 72   Temp 98.2 F (36.8 C)   Ht 5' 1.5" (1.562 m)   Wt 161 lb (73 kg)   SpO2 97%    BMI 29.93 kg/m      05/24/2022   10:21 AM 04/28/2022    8:28 AM 01/21/2022    1:27 PM  BP/Weight  Systolic BP 128 119 124  Diastolic BP 89 82 80  Wt. (Lbs) 161  165  BMI 29.93 kg/m2  30.67 kg/m2    Physical Exam Vitals and nursing note reviewed.  Constitutional:      General: She is not in acute distress.    Appearance: Normal appearance. She is not ill-appearing.  HENT:     Head: Normocephalic and atraumatic.  Eyes:     General:        Right eye: No discharge.        Left eye: No discharge.     Conjunctiva/sclera: Conjunctivae normal.  Cardiovascular:     Rate and Rhythm: Normal rate and regular rhythm.  Pulmonary:     Effort: Pulmonary effort is normal.     Breath sounds: Normal breath sounds. No wheezing, rhonchi or rales.  Neurological:     Mental Status: She is alert.  Psychiatric:        Mood and Affect: Mood normal.        Behavior: Behavior  normal.     Lab Results  Component Value Date   WBC 6.3 03/02/2021   HGB 12.8 03/02/2021   HCT 38.1 03/02/2021   PLT 300 03/02/2021   GLUCOSE 90 03/02/2021   CHOL 201 (H) 03/02/2021   TRIG 201 (H) 03/02/2021   HDL 51 03/02/2021   LDLCALC 115 (H) 03/02/2021   ALT 25 03/02/2021   AST 22 03/02/2021   NA 143 03/02/2021   K 5.4 (H) 03/02/2021   CL 104 03/02/2021   CREATININE 0.87 03/02/2021   BUN 15 03/02/2021   CO2 23 03/02/2021   TSH 3.300 04/21/2018     Assessment & Plan:   Problem List Items Addressed This Visit       Genitourinary   Vaginal dryness    Doing well on Estrace.  Estrace refilled.      Relevant Medications   estradiol (ESTRACE) 0.1 MG/GM vaginal cream     Other   Depression, major, single episode, moderate (HCC) - Primary    Stable on Lexapro.  Continue.      Encounter for screening colonoscopy    Referral placed for screening colonoscopy.      Relevant Orders   Ambulatory referral to Gastroenterology    Meds ordered this encounter  Medications   estradiol (ESTRACE) 0.1  MG/GM vaginal cream    Sig: 1 gram three times per week at hs or as directed by provider    Dispense:  42.5 g    Refill:  2    Avaya Mcjunkins DO [facility]

## 2022-05-24 NOTE — Patient Instructions (Signed)
Get your mammogram.  I am placing referral to GI.  Follow up annually.  Take care  Dr. Adriana Simas

## 2022-05-24 NOTE — Assessment & Plan Note (Addendum)
Doing well on Estrace.  Estrace refilled.

## 2022-05-24 NOTE — Assessment & Plan Note (Signed)
Referral placed for screening colonoscopy.

## 2022-07-23 ENCOUNTER — Other Ambulatory Visit: Payer: Self-pay | Admitting: *Deleted

## 2022-07-23 ENCOUNTER — Other Ambulatory Visit: Payer: Self-pay | Admitting: Family Medicine

## 2022-07-23 MED ORDER — ESCITALOPRAM OXALATE 10 MG PO TABS: 10.0000 mg | ORAL_TABLET | Freq: Every day | ORAL | 0 refills | Status: DC

## 2022-10-08 ENCOUNTER — Encounter: Payer: Self-pay | Admitting: Emergency Medicine

## 2022-10-08 ENCOUNTER — Ambulatory Visit
Admission: EM | Admit: 2022-10-08 | Discharge: 2022-10-08 | Disposition: A | Discharge status: Home / Self Care | Payer: Managed Care, Other (non HMO) | Attending: Nurse Practitioner | Admitting: Nurse Practitioner

## 2022-10-08 ENCOUNTER — Other Ambulatory Visit: Payer: Self-pay

## 2022-10-08 DIAGNOSIS — Z1152 Encounter for screening for COVID-19: Secondary | ICD-10-CM | POA: Insufficient documentation

## 2022-10-08 DIAGNOSIS — B9789 Other viral agents as the cause of diseases classified elsewhere: Secondary | ICD-10-CM | POA: Insufficient documentation

## 2022-10-08 DIAGNOSIS — H66002 Acute suppurative otitis media without spontaneous rupture of ear drum, left ear: Secondary | ICD-10-CM | POA: Diagnosis not present

## 2022-10-08 DIAGNOSIS — J329 Chronic sinusitis, unspecified: Secondary | ICD-10-CM | POA: Diagnosis present

## 2022-10-08 MED ORDER — PROMETHAZINE-DM 6.25-15 MG/5ML PO SYRP: 5.0000 mL | ORAL_SOLUTION | Freq: Every evening | ORAL | 0 refills | Status: DC | PRN

## 2022-10-08 MED ORDER — AMOXICILLIN 875 MG PO TABS: 875.0000 mg | ORAL_TABLET | Freq: Two times a day (BID) | ORAL | 0 refills | Status: AC

## 2022-10-08 MED ORDER — BENZONATATE 100 MG PO CAPS: 100.0000 mg | ORAL_CAPSULE | Freq: Three times a day (TID) | ORAL | 0 refills | Status: DC | PRN

## 2022-10-08 NOTE — Discharge Instructions (Addendum)
You have a viral sinus infection and ear infection in your left ear.  The symptoms should improve over the next week to 10 days.  If you develop chest pain or shortness of breath, go to the emergency room.  We have tested you today for COVID-19.  You will see the results in Mychart and we will call you with positive results.    Please stay home and isolate until you are aware of the results.    Some things that can make you feel better are: - Increased rest - Increasing fluid with water/sugar free electrolytes - Acetaminophen and ibuprofen as needed for fever/pain - Salt water gargling, chloraseptic spray and throat lozenges - OTC guaifenesin (Mucinex) 600 mg twice daily - Saline sinus flushes or a neti pot - Humidifying the air -Tessalon Perles during the day as needed for dry cough and cough syrup at nighttime as needed for dry cough  The amoxicillin will treat the ear infection.

## 2022-10-08 NOTE — ED Provider Notes (Signed)
[facility]    CSN: [id number] Arrival date & time: 10/08/22  0851      History   Chief Complaint Chief Complaint  Patient presents with   Cough    HPI Donnia Jemmott is a 51 y.o. female.   Patient presents for 1 day of chills, neck and shoulder pain/body aches, significant nasal congestion, postnasal drainage, sneezing, sore throat, sinus pressure and headache, left ear pain without drainage, decreased appetite, loss of taste, and fatigue.  She denies cough, shortness of breath or chest pain, chest congestion, abdominal pain, nausea/vomiting, diarrhea, new rash, and known sick contacts.  She has taken DayQuil, NyQuil, Afrin, and Benadryl for symptoms without relief.  Patient reports she has had all COVID-19 vaccinations, took it at home COVID test this morning that was negative.  Patient denies antibiotic use in the past 90 days, denies any history of chronic lung disease, asthma, COPD.    Past Medical History:  Diagnosis Date   Abnormal Pap smear of cervix    hx of cryotherapy to cervix age 59   Degenerative arthritis of knee, bilateral    Hypertriglyceridemia     Patient Active Problem List   Diagnosis Date Noted   Vaginal dryness 05/24/2022   Encounter for screening colonoscopy 05/24/2022   Depression, major, single episode, moderate ([place]) 10/03/2018   Allergic rhinitis 03/15/2013    Past Surgical History:  Procedure Laterality Date   ABDOMINOPLASTY  06/25/2021   BREAST CYST ASPIRATION Left    cystoclel repair  [id number]   UMBILICAL HERNIA REPAIR  06/25/2021    OB History     Gravida  2   Para  1   Term  1   Preterm      AB  1   Living  1      SAB      IAB      Ectopic      Multiple      Live Births               Home Medications    Prior to Admission medications   Medication Sig Start Date End Date Taking? Authorizing Provider  amoxicillin (AMOXIL) 875 MG tablet Take 1 tablet (875 mg total) by mouth 2 (two) times  daily for 5 days. 10/08/22 10/13/22 Yes Eulogio Bear, NP  benzonatate (TESSALON) 100 MG capsule Take 1 capsule (100 mg total) by mouth 3 (three) times daily as needed for cough. Do not take with alcohol or while driving or operating heavy machinery.  May cause drowsiness. 10/08/22  Yes Eulogio Bear, NP  promethazine-dextromethorphan (PROMETHAZINE-DM) 6.25-15 MG/5ML syrup Take 5 mLs by mouth at bedtime as needed for cough. Do not take with alcohol or while driving or operating heavy machinery.  May cause drowsiness. 10/08/22  Yes Eulogio Bear, NP  Calcium-Magnesium-Vitamin D (CALCIUM 1200+D3 PO) Take by mouth.    [provider]  escitalopram (LEXAPRO) 10 MG tablet Take 1 tablet (10 mg total) by mouth daily. 07/23/22   Coral Spikes, DO  estradiol (ESTRACE) 0.1 MG/GM vaginal cream 1 gram three times per week at hs or as directed by provider 05/24/22   Coral Spikes, DO  loratadine (HM LORATADINE) 10 MG tablet TAKE ONE TABLET (10MG  TOTAL) BY MOUTH DAILY 02/15/21   Elvia Collum M, DO  Multiple Vitamin (MULTIVITAMIN) tablet Take 1 tablet by mouth daily.    [provider]  Omega-3 Fatty Acids (FISH OIL PO) Take by mouth.  [provider]    Family History Family History  Problem Relation Age of Onset   Hypertension Mother    Arthritis Mother    Anxiety disorder Mother    Heart disease Father    Colitis Father    Dementia Father    Breast cancer Paternal Aunt    Cancer Maternal Grandmother        Ovarian    Social History Social History   Tobacco Use   Smoking status: Never   Smokeless tobacco: Never  Vaping Use   Vaping Use: Never used  Substance Use Topics   Alcohol use: Not Currently   Drug use: No     Allergies   Patient has no known allergies.   Review of Systems Review of Systems Per HPI  Physical Exam Triage Vital Signs ED Triage Vitals  Enc Vitals Group     BP 10/08/22 1003 130/83     Pulse Rate 10/08/22 1003 98      Resp 10/08/22 1003 20     Temp 10/08/22 1003 98.5 F (36.9 C)     Temp Source 10/08/22 1003 Oral     SpO2 10/08/22 1003 95 %     Weight --      Height --      Head Circumference --      Peak Flow --      Pain Score 10/08/22 1000 5     Pain Loc --      Pain Edu? --      Excl. in [place]? --    No data found.  Updated Vital Signs BP 130/83 (BP Location: Right Arm)   Pulse 98   Temp 98.5 F (36.9 C) (Oral)   Resp 20   SpO2 95%   Visual Acuity Right Eye Distance:   Left Eye Distance:   Bilateral Distance:    Right Eye Near:   Left Eye Near:    Bilateral Near:     Physical Exam Vitals and nursing note reviewed.  Constitutional:      General: She is not in acute distress.    Appearance: Normal appearance. She is not ill-appearing or toxic-appearing.  HENT:     Head: Normocephalic and atraumatic.     Right Ear: Tympanic membrane, ear canal and external ear normal.     Left Ear: Ear canal and external ear normal. Tympanic membrane is injected and erythematous.     Nose: Congestion and rhinorrhea present.     Right Turbinates: Enlarged and swollen.     Left Turbinates: Enlarged and swollen.     Right Sinus: Maxillary sinus tenderness present. No frontal sinus tenderness.     Left Sinus: Maxillary sinus tenderness present. No frontal sinus tenderness.     Mouth/Throat:     Mouth: Mucous membranes are moist.     Pharynx: Oropharynx is clear. No oropharyngeal exudate or posterior oropharyngeal erythema.     Tonsils: No tonsillar exudate. 0 on the right.     Comments: Postnasal drainage Eyes:     General: No scleral icterus.    Extraocular Movements: Extraocular movements intact.  Cardiovascular:     Rate and Rhythm: Normal rate and regular rhythm.  Pulmonary:     Effort: Pulmonary effort is normal. No respiratory distress.     Breath sounds: Normal breath sounds. No wheezing, rhonchi or rales.  Abdominal:     General: Abdomen is flat. Bowel sounds are normal. There is  no distension.     Palpations: Abdomen  is soft.     Tenderness: There is no abdominal tenderness. There is no guarding.  Musculoskeletal:     Cervical back: Normal range of motion and neck supple.  Lymphadenopathy:     Cervical: No cervical adenopathy.  Skin:    General: Skin is warm and dry.     Coloration: Skin is not jaundiced or pale.     Findings: No erythema or rash.  Neurological:     Mental Status: She is alert and oriented to person, place, and time.  Psychiatric:        Behavior: Behavior is cooperative.      UC Treatments / Results  Labs (all labs ordered are listed, but only abnormal results are displayed) Labs Reviewed  SARS CORONAVIRUS 2 (TAT 6-24 HRS)    EKG   Radiology No results found.  Procedures Procedures (including critical care time)  Medications Ordered in UC Medications - No data to display  Initial Impression / Assessment and Plan / UC Course  I have reviewed the triage vital signs and the nursing notes.  Pertinent labs & imaging results that were available during my care of the patient were reviewed by me and considered in my medical decision making (see chart for details).   Patient is well-appearing, normotensive, afebrile, not tachycardic, not tachypneic, oxygenating well on room air.    Encounter for screening for COVID-19 Viral sinusitis Suspect viral etiology COVID-19 testing obtained Supportive care discussed-specifically recommended saline nasal rinses, cancer cough suppressants if she begins coughing-prescription sent to pharmacy ER and return precautions discussed Note given for work  Non-recurrent acute suppurative otitis media of left ear without spontaneous rupture of tympanic membrane Discussed that this is likely viral, however given significant pain, prescription given for amoxicillin if pain does not improve after 4 to 5 days  The patient was given the opportunity to ask questions.  All questions answered to their  satisfaction.  The patient is in agreement to this plan.  Final Clinical Impressions(s) / UC Diagnoses   Final diagnoses:  Encounter for screening for COVID-19  Viral sinusitis  Non-recurrent acute suppurative otitis media of left ear without spontaneous rupture of tympanic membrane     Discharge Instructions      You have a viral sinus infection and ear infection in your left ear.  The symptoms should improve over the next week to 10 days.  If you develop chest pain or shortness of breath, go to the emergency room.  We have tested you today for COVID-19.  You will see the results in Mychart and we will call you with positive results.    Please stay home and isolate until you are aware of the results.    Some things that can make you feel better are: - Increased rest - Increasing fluid with water/sugar free electrolytes - Acetaminophen and ibuprofen as needed for fever/pain - Salt water gargling, chloraseptic spray and throat lozenges - OTC guaifenesin (Mucinex) 600 mg twice daily - Saline sinus flushes or a neti pot - Humidifying the air -Tessalon Perles during the day as needed for dry cough and cough syrup at nighttime as needed for dry cough  The amoxicillin will treat the ear infection.       ED Prescriptions     Medication Sig Dispense Auth. Provider   amoxicillin (AMOXIL) 875 MG tablet Take 1 tablet (875 mg total) by mouth 2 (two) times daily for 5 days. 10 tablet Eulogio Bear, NP   promethazine-dextromethorphan (PROMETHAZINE-DM) 6.25-15  MG/5ML syrup Take 5 mLs by mouth at bedtime as needed for cough. Do not take with alcohol or while driving or operating heavy machinery.  May cause drowsiness. 118 mL Cathlean Marseilles A, NP   benzonatate (TESSALON) 100 MG capsule Take 1 capsule (100 mg total) by mouth 3 (three) times daily as needed for cough. Do not take with alcohol or while driving or operating heavy machinery.  May cause drowsiness. 21 capsule Valentino Nose, NP      PDMP not reviewed this encounter.   Valentino Nose, NP 10/08/22 1035

## 2022-10-08 NOTE — ED Triage Notes (Signed)
Pt reports cough, nasal congestion, headache, left ear pain, generalized body aches since last night. Pt reports home covid test negative and reports was vaccinated for covid/flu x9 days ago. Symptoms have not responded to otc medication.

## 2022-10-09 LAB — SARS CORONAVIRUS 2 (TAT 6-24 HRS): SARS Coronavirus 2: NEGATIVE

## 2022-10-14 ENCOUNTER — Encounter: Payer: Self-pay | Admitting: Nurse Practitioner

## 2022-10-14 ENCOUNTER — Ambulatory Visit: Payer: Managed Care, Other (non HMO) | Admitting: Nurse Practitioner

## 2022-10-14 VITALS — BP 118/78 | HR 98 | Temp 98.6°F | Wt 162.0 lb

## 2022-10-14 DIAGNOSIS — R051 Acute cough: Secondary | ICD-10-CM | POA: Diagnosis not present

## 2022-10-14 MED ORDER — ALBUTEROL SULFATE HFA 108 (90 BASE) MCG/ACT IN AERS: 2.0000 | INHALATION_SPRAY | Freq: Four times a day (QID) | RESPIRATORY_TRACT | 0 refills | Status: DC | PRN

## 2022-10-14 MED ORDER — PREDNISONE 20 MG PO TABS: 20.0000 mg | ORAL_TABLET | Freq: Every day | ORAL | 0 refills | Status: AC

## 2022-10-14 NOTE — Progress Notes (Signed)
   Subjective:    Patient ID: Renee Cordova, female    DOB: [birth year], 51 y.o.   MRN: [id number]  HPI UC follow up sinusitis and left ear discomfort - completed antibiotics on Saturday - still a little nasal congestion and cough productive Last low grade fever on Saturday. Overall feeling better. But productive coughing continues.   Denies current fevers, body aches, chills, SOB, or difficulty breathing.   Review of Systems  Respiratory:  Positive for cough.   All other systems reviewed and are negative.      Objective:   Physical Exam Vitals reviewed.  Constitutional:      General: She is not in acute distress.    Appearance: Normal appearance. She is normal weight. She is not ill-appearing, toxic-appearing or diaphoretic.  HENT:     Head: Normocephalic and atraumatic.  Cardiovascular:     Rate and Rhythm: Normal rate and regular rhythm.     Pulses: Normal pulses.     Heart sounds: Normal heart sounds. No murmur heard. Pulmonary:     Effort: Pulmonary effort is normal. No respiratory distress.     Breath sounds: Wheezing present.     Comments: Fine expiratory wheezing noted not right and left lobes.  Musculoskeletal:     Comments: Grossly intact  Skin:    General: Skin is warm.     Capillary Refill: Capillary refill takes less than 2 seconds.  Neurological:     Mental Status: She is alert.     Comments: Grossly intact  Psychiatric:        Mood and Affect: Mood normal.        Behavior: Behavior normal.           Assessment & Plan:   1. Acute cough - Wheezing asculated during lung exam - Likely residual inflammation secondary to illness. Will treat with prednieson and albuterol inhaler as needed - Low suspicions for pneumonia due to lack of systemic symptoms and recent abx use.  - predniSONE (DELTASONE) 20 MG tablet; Take 1 tablet (20 mg total) by mouth daily with breakfast for 5 days.  Dispense: 5 tablet; Refill: 0 - albuterol (VENTOLIN HFA) 108 (90 Base)  MCG/ACT inhaler; Inhale 2 puffs into the lungs every 6 (six) hours as needed for wheezing or shortness of breath.  Dispense: 8 g; Refill: 0 - RTC if symptoms persist or do not improve

## 2022-11-11 ENCOUNTER — Other Ambulatory Visit: Payer: Self-pay | Admitting: Nurse Practitioner

## 2022-11-11 DIAGNOSIS — Z1231 Encounter for screening mammogram for malignant neoplasm of breast: Secondary | ICD-10-CM

## 2022-12-19 ENCOUNTER — Telehealth: Payer: Self-pay | Admitting: Family Medicine

## 2022-12-19 DIAGNOSIS — Z1211 Encounter for screening for malignant neoplasm of colon: Secondary | ICD-10-CM

## 2022-12-19 NOTE — Telephone Encounter (Signed)
Patient is requesting a referral for colonoscopy to Dr. Cecil Cobbs at [facility] .

## 2022-12-23 NOTE — Telephone Encounter (Signed)
Cook, Jayce G, DO     Please place referral   

## 2022-12-23 NOTE — Telephone Encounter (Signed)
Referral ordered in EPIC. 

## 2023-01-03 ENCOUNTER — Ambulatory Visit
Admission: RE | Admit: 2023-01-03 | Discharge: 2023-01-03 | Disposition: A | Discharge status: Home / Self Care | Payer: Managed Care, Other (non HMO) | Source: Ambulatory Visit | Attending: Nurse Practitioner | Admitting: Nurse Practitioner

## 2023-01-03 DIAGNOSIS — Z1231 Encounter for screening mammogram for malignant neoplasm of breast: Secondary | ICD-10-CM

## 2023-01-07 ENCOUNTER — Telehealth: Payer: Self-pay

## 2023-01-07 NOTE — Telephone Encounter (Signed)
Bobette Mo calling from [facility] where Karma Ganja, NP is currently located. Was wanting to contact us re: receiving pt's recent mammo report and wanted to make sure that we received it as well. Results just came in, pt has not been contacted yet for f/u appt. Will leave encounter open to make sure pt gets scheduled.

## 2023-01-08 ENCOUNTER — Other Ambulatory Visit: Payer: Self-pay | Admitting: Nurse Practitioner

## 2023-01-08 DIAGNOSIS — R928 Other abnormal and inconclusive findings on diagnostic imaging of breast: Secondary | ICD-10-CM

## 2023-01-08 NOTE — Telephone Encounter (Signed)
Mammogram report reviewed by me in Epic.   Calcifications noted in the right breast.   Please let patient will be contacted by the [facility] to schedule additional imaging.   She needs to be placed in mammogram hold.   I will see her for her annual exam on 01/22/23.

## 2023-01-08 NOTE — Telephone Encounter (Signed)
Placed in mammogram hold.

## 2023-01-09 NOTE — Progress Notes (Unsigned)
52 y.o. G82P1011 Married Caucasian female here for annual exam.    Having dryness with intercourse.  Using vaginal estrogen cream 2 - 3 nights per week.  Insomnia and night sweats.  No daytime hot flashes.   Patient's last menstrual period was 07/26/2022 (approximate).    6 months w/o a cycle       Sexually active: Yes.    The current method of family planning is perimenopausal/ vasectomy.    Exercising: Yes.     walking Smoker:  no  Health Maintenance: Pap:  12/26/17 neg: HR HPV neg, 09-09-14 Neg:neg HR HPV  History of abnormal Pap:  yes, Hx of cryotherapy age 32   MMG:  Diagnostic MMG in R breast 01/23/23, 01/03/23  Breast Density Category C, BI-RADS CATEGORY 0 incomplete Colonoscopy:  2009 benign. Done due to abd.pain.  Scheduled consult this year, unsure when BMD:   n/a  Result  n/a TDaP:  09/09/14 Gardasil:   no Screening Labs: PCP   reports that she has never smoked. She has never used smokeless tobacco. She reports that she does not currently use alcohol. She reports that she does not use drugs.  Past Medical History:  Diagnosis Date   Abnormal Pap smear of cervix    hx of cryotherapy to cervix age 30   Degenerative arthritis of knee, bilateral    Hypertriglyceridemia     Past Surgical History:  Procedure Laterality Date   ABDOMINOPLASTY  06/25/2021   BREAST CYST ASPIRATION Left    cystoclel repair  [id number]   UMBILICAL HERNIA REPAIR  06/25/2021    Current Outpatient Medications  Medication Sig Dispense Refill   Calcium-Magnesium-Vitamin D (CALCIUM 1200+D3 PO) Take by mouth.     estradiol (ESTRACE) 0.1 MG/GM vaginal cream 1 gram three times per week at hs or as directed by provider 42.5 g 2   loratadine (HM LORATADINE) 10 MG tablet TAKE ONE TABLET ('10MG'$  TOTAL) BY MOUTH DAILY 90 tablet 1   Multiple Vitamin (MULTIVITAMIN) tablet Take 1 tablet by mouth daily.     Omega-3 Fatty Acids (FISH OIL PO) Take by mouth.     No current facility-administered medications for this  visit.    Family History  Problem Relation Age of Onset   Hypertension Mother    Arthritis Mother    Anxiety disorder Mother    Heart disease Father    Colitis Father    Dementia Father    Breast cancer Paternal Aunt    Cancer Maternal Grandmother        Ovarian    Review of Systems  All other systems reviewed and are negative.   Exam:   BP 118/72 (BP Location: Right Arm, Patient Position: Sitting, Cuff Size: Normal)   Pulse 85   Ht '5\' 2"'$  (1.575 m)   Wt 156 lb (70.8 kg)   LMP 07/26/2022 (Approximate)   SpO2 97%   BMI 28.53 kg/m     General appearance: alert, cooperative and appears stated age Head: normocephalic, without obvious abnormality, atraumatic Neck: no adenopathy, supple, symmetrical, trachea midline and thyroid normal to inspection and palpation Lungs: clear to auscultation bilaterally Breasts: normal appearance, no masses or tenderness, No nipple retraction or dimpling, No nipple discharge or bleeding, No axillary adenopathy Heart: regular rate and rhythm Abdomen: soft, non-tender; no masses, no organomegaly Extremities: extremities normal, atraumatic, no cyanosis or edema Skin: skin color, texture, turgor normal. No rashes or lesions Lymph nodes: cervical, supraclavicular, and axillary nodes normal. Neurologic: grossly normal  Pelvic: External  genitalia:  no lesions              No abnormal inguinal nodes palpated.              Urethra:  normal appearing urethra with no masses, tenderness or lesions              Bartholins and Skenes: normal                 Vagina: normal appearing vagina with normal color and discharge, no lesions              Cervix: no lesions              Pap taken: yes Bimanual Exam:  Uterus:  normal size, contour, position, consistency, mobility, non-tender              Adnexa: no mass, fullness, tenderness              Rectal exam: yes.  Confirms.              Anus:  normal sphincter tone, no lesions  Chaperone was present for  exam:  Raquel Sarna  Assessment:   Well woman visit with gynecologic exam. Remote hx of abnormal pap.  Amenorrhea.   Menopausal symptoms.  Vaginal atrophy. Hx cystocele repair.  Plan: Mammogram screening discussed.  Will get dx mammogram done.  Self breast awareness reviewed. Pap and HR HPV collected. Guidelines for Calcium, Vitamin D, regular exercise program including cardiovascular and weight bearing exercise. Check FSH and estradiol. We discussed HRT, SSRI/SNRI, and Neurontin, herbs. Discused WHI and use of HRT which can increase risk of PE, DVT, MI, stroke and breast cancer.  She is interested in HRT.   Will await mammogram results. Refill of vaginal estrogen cream.  Brochure on menopause. Follow up annually and prn.   After visit summary provided.

## 2023-01-10 NOTE — Telephone Encounter (Signed)
Dx mammo scheduled for 01/23/2023.

## 2023-01-14 NOTE — Telephone Encounter (Signed)
Encounter reviewed and closed.  

## 2023-01-22 ENCOUNTER — Ambulatory Visit (INDEPENDENT_AMBULATORY_CARE_PROVIDER_SITE_OTHER): Payer: Managed Care, Other (non HMO) | Admitting: Obstetrics and Gynecology

## 2023-01-22 ENCOUNTER — Encounter: Payer: Self-pay | Admitting: Obstetrics and Gynecology

## 2023-01-22 ENCOUNTER — Other Ambulatory Visit (HOSPITAL_COMMUNITY)
Admission: RE | Admit: 2023-01-22 | Discharge: 2023-01-22 | Disposition: A | Discharge status: Home / Self Care | Payer: Managed Care, Other (non HMO) | Source: Ambulatory Visit | Attending: Obstetrics and Gynecology | Admitting: Obstetrics and Gynecology

## 2023-01-22 VITALS — BP 118/72 | HR 85 | Ht 62.0 in | Wt 156.0 lb

## 2023-01-22 DIAGNOSIS — Z01419 Encounter for gynecological examination (general) (routine) without abnormal findings: Secondary | ICD-10-CM | POA: Diagnosis not present

## 2023-01-22 DIAGNOSIS — N951 Menopausal and female climacteric states: Secondary | ICD-10-CM

## 2023-01-22 DIAGNOSIS — Z124 Encounter for screening for malignant neoplasm of cervix: Secondary | ICD-10-CM | POA: Insufficient documentation

## 2023-01-22 DIAGNOSIS — N898 Other specified noninflammatory disorders of vagina: Secondary | ICD-10-CM

## 2023-01-22 MED ORDER — ESTRADIOL 0.1 MG/GM VA CREA: TOPICAL_CREAM | VAGINAL | 2 refills | Status: DC

## 2023-01-22 NOTE — Patient Instructions (Signed)

## 2023-01-23 ENCOUNTER — Other Ambulatory Visit: Payer: Self-pay | Admitting: Obstetrics and Gynecology

## 2023-01-23 ENCOUNTER — Ambulatory Visit
Admission: RE | Admit: 2023-01-23 | Discharge: 2023-01-23 | Disposition: A | Discharge status: Home / Self Care | Payer: Managed Care, Other (non HMO) | Source: Ambulatory Visit | Attending: Nurse Practitioner | Admitting: Nurse Practitioner

## 2023-01-23 ENCOUNTER — Encounter: Payer: Self-pay | Admitting: Radiology

## 2023-01-23 DIAGNOSIS — R928 Other abnormal and inconclusive findings on diagnostic imaging of breast: Secondary | ICD-10-CM

## 2023-01-23 LAB — ESTRADIOL: Estradiol: 60 pg/mL

## 2023-01-23 LAB — FOLLICLE STIMULATING HORMONE: FSH: 52.6 m[IU]/mL

## 2023-01-24 ENCOUNTER — Other Ambulatory Visit: Payer: Self-pay | Admitting: Nurse Practitioner

## 2023-01-24 DIAGNOSIS — R921 Mammographic calcification found on diagnostic imaging of breast: Secondary | ICD-10-CM

## 2023-01-24 LAB — CYTOLOGY - PAP
Comment: NEGATIVE
Diagnosis: NEGATIVE
High risk HPV: NEGATIVE

## 2023-01-27 ENCOUNTER — Encounter: Payer: Self-pay | Admitting: Obstetrics and Gynecology

## 2023-01-28 ENCOUNTER — Other Ambulatory Visit: Payer: Self-pay | Admitting: Obstetrics and Gynecology

## 2023-01-28 DIAGNOSIS — N898 Other specified noninflammatory disorders of vagina: Secondary | ICD-10-CM

## 2023-02-03 ENCOUNTER — Ambulatory Visit
Admission: RE | Admit: 2023-02-03 | Discharge: 2023-02-03 | Disposition: A | Discharge status: Home / Self Care | Payer: Managed Care, Other (non HMO) | Source: Ambulatory Visit | Attending: Nurse Practitioner | Admitting: Nurse Practitioner

## 2023-02-03 DIAGNOSIS — R921 Mammographic calcification found on diagnostic imaging of breast: Secondary | ICD-10-CM

## 2023-02-03 HISTORY — PX: BREAST BIOPSY: SHX20

## 2023-02-04 ENCOUNTER — Telehealth: Payer: Self-pay | Admitting: Obstetrics and Gynecology

## 2023-02-04 NOTE — Telephone Encounter (Signed)
-----   Message from Bary Leriche, RN sent at 02/04/2023  3:00 PM EDT ----- Regarding: Pathology report Good afternoon Dr. Quincy Simmonds,  You were not copied on this patient's path report.  She wanted to be sure you received it.  The final report is in [facility] and her recommendation is to return to screening in February 2025.  The patient is aware of results and recommendations.  Patient: Renee Cordova, Renee Cordova Collected: 02/03/2023 Client: [facility]  CC: Thersa Salt DO  REPORT OF SURGICAL PATHOLOGY FINAL DIAGNOSIS Diagnosis  Breast, right, needle core biopsy, superior posterior (ribbon clip) FIBROCYSTIC CHANGES WITH STROMA FIBROSIS, MICROCYSTS, APOCRINE CYSTS AND CALCIFICATIONS.  Kristopher Glee MD [facility] Pathologist, Electronic Signature  Thank you,  Terie Purser, RN, RNFA Nurse [place] [phone]

## 2023-02-04 NOTE — Telephone Encounter (Signed)
Removed from mammogram hold.

## 2023-02-04 NOTE — Telephone Encounter (Signed)
Ok to remove from mammogram hold and return to routine screening.

## 2023-02-10 NOTE — Progress Notes (Unsigned)
GYNECOLOGY  VISIT   HPI: 52 y.o.   Married  Caucasian  female   G2P1011 with Patient's last menstrual period was 07/26/2022 (approximate).   here for   discuss hormones.  Having insomnia, night sweats, and vaginal dryness.   She is interested in hormone therapy to treat her symptoms.  Currently using vaginal estrogen cream.   FSH 52.6 and estradiol 60 on 01/22/23.   Had a reaction to the steristrips used on her skin after her breast biopsy.   GYNECOLOGIC HISTORY: Patient's last menstrual period was 07/26/2022 (approximate). Contraception:  perimenopause/vasectomy Menopausal hormone therapy:  n/a Last mammogram:  Diagnostic MMG in R breast 01/23/23 BI-RADS CAT 4 sus, 01/03/23  Breast Density Category C, BI-RADS CATEGORY 0 incomplete. Right breast biopsy showed fibrocystic changes and fibrosis.  No malignancy noted.  Last pap smear:  01/22/23 neg: HR HPV neg, 12/26/17 neg: HR HPV neg, 09-09-14 Neg:neg HR HPV         OB History     Gravida  2   Para  1   Term  1   Preterm      AB  1   Living  1      SAB      IAB      Ectopic      Multiple      Live Births                 Patient Active Problem List   Diagnosis Date Noted   Vaginal dryness 05/24/2022   Encounter for screening colonoscopy 05/24/2022   Depression, major, single episode, moderate ([place]) 10/03/2018   Allergic rhinitis 03/15/2013    Past Medical History:  Diagnosis Date   Abnormal Pap smear of cervix    hx of cryotherapy to cervix age 60   Degenerative arthritis of knee, bilateral    Hypertriglyceridemia     Past Surgical History:  Procedure Laterality Date   ABDOMINOPLASTY  06/25/2021   BREAST BIOPSY Right 02/03/2023   MM RT BREAST BX W LOC DEV 1ST LESION IMAGE BX SPEC STEREO GUIDE 02/03/2023 [facility]   BREAST CYST ASPIRATION Left    cystoclel repair  [id number]   UMBILICAL HERNIA REPAIR  06/25/2021    Current Outpatient Medications  Medication Sig Dispense Refill    Calcium-Magnesium-Vitamin D (CALCIUM 1200+D3 PO) Take by mouth.     estradiol (ESTRACE) 0.1 MG/GM vaginal cream 1 gram three times per week at hs or as directed by provider 42.5 g 2   estradiol (VIVELLE-DOT) 0.05 MG/24HR patch Place 1 patch (0.05 mg total) onto the skin 2 (two) times a week. 8 patch 2   loratadine (HM LORATADINE) 10 MG tablet TAKE ONE TABLET (10MG  TOTAL) BY MOUTH DAILY 90 tablet 1   Multiple Vitamin (MULTIVITAMIN) tablet Take 1 tablet by mouth daily.     Multiple Vitamins-Minerals (ONE DAILY CALCIUM/IRON) TABS Take 1 tablet by mouth daily.     Omega-3 Fatty Acids (FISH OIL PO) Take by mouth.     progesterone (PROMETRIUM) 100 MG capsule Take 1 capsule (100 mg total) by mouth daily. 30 capsule 2   No current facility-administered medications for this visit.     ALLERGIES: Patient has no known allergies.  Family History  Problem Relation Age of Onset   Hypertension Mother    Arthritis Mother    Anxiety disorder Mother    Heart disease Father    Colitis Father    Dementia Father    Breast cancer Paternal Aunt  Cancer Maternal Grandmother        Ovarian    Social History   Socioeconomic History   Marital status: Married    Spouse name: Not on file   Number of children: Not on file   Years of education: Not on file   Highest education level: Not on file  Occupational History   Not on file  Tobacco Use   Smoking status: Never   Smokeless tobacco: Never  Vaping Use   Vaping Use: Never used  Substance and Sexual Activity   Alcohol use: Not Currently   Drug use: No   Sexual activity: Yes    Partners: Male    Comment: Husband had vasectomy   Other Topics Concern   Not on file  Social History Narrative   Not on file   Social Determinants of Health   Financial Resource Strain: Not on file  Food Insecurity: Not on file  Transportation Needs: Not on file  Physical Activity: Not on file  Stress: Not on file  Social Connections: Not on file  Intimate  Partner Violence: Not on file    Review of Systems  All other systems reviewed and are negative.   PHYSICAL EXAMINATION:    BP 118/76 (BP Location: Left Arm, Patient Position: Sitting, Cuff Size: Normal)   Ht 5\' 2"  (1.575 m)   Wt 155 lb (70.3 kg)   LMP 07/26/2022 (Approximate)   BMI 28.35 kg/m     General appearance: alert, cooperative and appears stated age  ASSESSMENT  Menopausal symptoms. Perimenopause by hormone levels. Status post right breast biopsy - benign.   PLAN  We discussed HRT and different treatment options.  Discused WHI and use of HRT which can increase risk of PE, DVT, MI, stroke and breast cancer.  We discussed benefits as well of treating vasomotor symptoms, improving quality of life, reducing risk of osteoporosis and colon cancer.  She will do a trial of Vivelle Dot 0.05 mg to skin twice weekly and Prometrium 100 mg q hs.  1 month prescription of each and 2 refills.  She will let me know if she has a skin reaction to the transdermal estrogen patches.  She understands she may have a future menstrual cycle according to her hormone levels.  Fu in about 10 weeks, or sooner if needed.  26 min  total time was spent for this patient encounter, including preparation, face-to-face counseling with the patient, coordination of care, and documentation of the encounter.

## 2023-02-13 ENCOUNTER — Ambulatory Visit: Payer: Managed Care, Other (non HMO) | Admitting: Obstetrics and Gynecology

## 2023-02-13 ENCOUNTER — Encounter: Payer: Self-pay | Admitting: Obstetrics and Gynecology

## 2023-02-13 VITALS — BP 118/76 | Ht 62.0 in | Wt 155.0 lb

## 2023-02-13 DIAGNOSIS — N951 Menopausal and female climacteric states: Secondary | ICD-10-CM

## 2023-02-13 MED ORDER — ESTRADIOL 0.05 MG/24HR TD PTTW: 1.0000 | MEDICATED_PATCH | TRANSDERMAL | 2 refills | Status: DC

## 2023-02-13 MED ORDER — PROGESTERONE MICRONIZED 100 MG PO CAPS: 100.0000 mg | ORAL_CAPSULE | Freq: Every day | ORAL | 2 refills | Status: DC

## 2023-02-13 NOTE — Patient Instructions (Signed)

## 2023-04-03 NOTE — Progress Notes (Signed)
GYNECOLOGY  VISIT   HPI: 52 y.o.   Married  Caucasian  female   G2P1011 with No LMP recorded. (Menstrual status: Perimenopausal).   here for  10 week  hormone recheck   Sleeping better and no night sweats, otherwise no changes in how she feels.   Has vaginal tenderness and dryness.  Using vaginal estrogen 1.5 grams three times a week.   Wants to prevent osteoporosis.   GYNECOLOGIC HISTORY: No LMP recorded. (Menstrual status: Perimenopausal). Contraception:  perimenopause/vasectomy  Menopausal hormone therapy:  n/a Last mammogram:   Diagnostic MMG in R breast 01/23/23 BI-RADS CAT 4 sus, 01/03/23  Breast Density Category C, BI-RADS CATEGORY 0 incomplete.  Last pap smear:    01/22/23 neg: HR HPV neg, 12/26/17 neg: HR HPV neg, 09-09-14 Neg:neg HR HPV         OB History     Gravida  2   Para  1   Term  1   Preterm      AB  1   Living  1      SAB      IAB      Ectopic      Multiple      Live Births                 Patient Active Problem List   Diagnosis Date Noted   Vaginal dryness 05/24/2022   Encounter for screening colonoscopy 05/24/2022   Depression, major, single episode, moderate ([facility]) 10/03/2018   Allergic rhinitis 03/15/2013    Past Medical History:  Diagnosis Date   Abnormal Pap smear of cervix    hx of cryotherapy to cervix age 55   Degenerative arthritis of knee, bilateral    Hypertriglyceridemia     Past Surgical History:  Procedure Laterality Date   ABDOMINOPLASTY  06/25/2021   BREAST BIOPSY Right 02/03/2023   MM RT BREAST BX W LOC DEV 1ST LESION IMAGE BX SPEC STEREO GUIDE 02/03/2023 [facility]   BREAST CYST ASPIRATION Left    cystoclel repair  2009   UMBILICAL HERNIA REPAIR  06/25/2021    Current Outpatient Medications  Medication Sig Dispense Refill   Calcium-Magnesium-Vitamin D (CALCIUM 1200+D3 PO) Take by mouth.     estradiol (ESTRACE) 0.1 MG/GM vaginal cream 1 gram three times per week at hs or as directed by provider 42.5 g  2   estradiol (VIVELLE-DOT) 0.075 MG/24HR Place 1 patch onto the skin 2 (two) times a week. 24 patch 3   loratadine (HM LORATADINE) 10 MG tablet TAKE ONE TABLET (10MG  TOTAL) BY MOUTH DAILY 90 tablet 1   Multiple Vitamin (MULTIVITAMIN) tablet Take 1 tablet by mouth daily.     Multiple Vitamins-Minerals (ONE DAILY CALCIUM/IRON) TABS Take 1 tablet by mouth daily.     Omega-3 Fatty Acids (FISH OIL PO) Take by mouth.     progesterone (PROMETRIUM) 100 MG capsule Take 1 capsule (100 mg total) by mouth daily. 90 capsule 3   No current facility-administered medications for this visit.     ALLERGIES: Patient has no known allergies.  Family History  Problem Relation Age of Onset   Hypertension Mother    Arthritis Mother    Anxiety disorder Mother    Heart disease Father    Colitis Father    Dementia Father    Breast cancer Paternal Aunt    Cancer Maternal Grandmother        Ovarian    Social History   Socioeconomic History  Marital status: Married    Spouse name: Not on file   Number of children: Not on file   Years of education: Not on file   Highest education level: Not on file  Occupational History   Not on file  Tobacco Use   Smoking status: Never   Smokeless tobacco: Never  Vaping Use   Vaping Use: Never used  Substance and Sexual Activity   Alcohol use: Not Currently   Drug use: No   Sexual activity: Yes    Partners: Male    Comment: Husband had vasectomy   Other Topics Concern   Not on file  Social History Narrative   Not on file   Social Determinants of Health   Financial Resource Strain: Not on file  Food Insecurity: Not on file  Transportation Needs: Not on file  Physical Activity: Not on file  Stress: Not on file  Social Connections: Not on file  Intimate Partner Violence: Not on file    Review of Systems  All other systems reviewed and are negative.   PHYSICAL EXAMINATION:    BP 122/80 (BP Location: Left Arm, Patient Position: Sitting, Cuff Size:  Normal)   Pulse (!) 56   Ht 5\' 2"  (1.575 m)   Wt 152 lb (68.9 kg)   SpO2 95%   BMI 27.80 kg/m     General appearance: alert, cooperative and appears stated age   ASSESSMENT  HRT surveillance.   PLAN  Will increase to Vivelle Dot 0.75 mg twice weekly.  #24, RF 3.  Continue Prometrium 100 mg po q hs.  #24, RF 3.  Estrace vaginal cream 1 gram pv at hs 3 times per week.  She will call for any vaginal bleeding.  Fu for annual exam and prn.

## 2023-04-17 ENCOUNTER — Encounter: Payer: Self-pay | Admitting: Obstetrics and Gynecology

## 2023-04-17 ENCOUNTER — Ambulatory Visit: Payer: Managed Care, Other (non HMO) | Admitting: Obstetrics and Gynecology

## 2023-04-17 VITALS — BP 122/80 | HR 56 | Ht 62.0 in | Wt 152.0 lb

## 2023-04-17 DIAGNOSIS — Z5181 Encounter for therapeutic drug level monitoring: Secondary | ICD-10-CM | POA: Diagnosis not present

## 2023-04-17 DIAGNOSIS — Z79890 Hormone replacement therapy (postmenopausal): Secondary | ICD-10-CM

## 2023-04-17 MED ORDER — ESTRADIOL 0.075 MG/24HR TD PTTW: 1.0000 | MEDICATED_PATCH | TRANSDERMAL | 3 refills | Status: DC

## 2023-04-17 MED ORDER — PROGESTERONE MICRONIZED 100 MG PO CAPS: 100.0000 mg | ORAL_CAPSULE | Freq: Every day | ORAL | 3 refills | Status: DC

## 2023-07-24 ENCOUNTER — Encounter: Payer: Self-pay | Admitting: Obstetrics and Gynecology

## 2023-07-25 ENCOUNTER — Other Ambulatory Visit: Payer: Managed Care, Other (non HMO)

## 2023-07-25 ENCOUNTER — Other Ambulatory Visit: Payer: Self-pay | Admitting: Obstetrics and Gynecology

## 2023-07-25 DIAGNOSIS — Z79890 Hormone replacement therapy (postmenopausal): Secondary | ICD-10-CM

## 2023-07-25 DIAGNOSIS — N939 Abnormal uterine and vaginal bleeding, unspecified: Secondary | ICD-10-CM

## 2023-07-25 NOTE — Telephone Encounter (Signed)
FYI. Pt coming in today for [facility] lab. Encounter closed.

## 2023-07-26 LAB — FOLLICLE STIMULATING HORMONE: FSH: 31.2 m[IU]/mL

## 2023-07-29 ENCOUNTER — Other Ambulatory Visit: Payer: Self-pay | Admitting: Obstetrics and Gynecology

## 2023-07-29 DIAGNOSIS — N950 Postmenopausal bleeding: Secondary | ICD-10-CM

## 2023-07-29 DIAGNOSIS — Z79890 Hormone replacement therapy (postmenopausal): Secondary | ICD-10-CM

## 2023-07-29 NOTE — Telephone Encounter (Signed)
See result note dated 07/29/23, patient called.   Routing to Dr. Marjorie Smolder.

## 2023-07-30 ENCOUNTER — Other Ambulatory Visit: Payer: Self-pay | Admitting: *Deleted

## 2023-07-30 DIAGNOSIS — N950 Postmenopausal bleeding: Secondary | ICD-10-CM

## 2023-08-04 ENCOUNTER — Ambulatory Visit (HOSPITAL_COMMUNITY)
Admission: RE | Admit: 2023-08-04 | Discharge: 2023-08-04 | Disposition: A | Discharge status: Home / Self Care | Payer: Managed Care, Other (non HMO) | Source: Ambulatory Visit | Attending: Obstetrics and Gynecology | Admitting: Obstetrics and Gynecology

## 2023-08-04 DIAGNOSIS — N950 Postmenopausal bleeding: Secondary | ICD-10-CM | POA: Insufficient documentation

## 2023-08-11 ENCOUNTER — Telehealth: Payer: Self-pay | Admitting: Obstetrics and Gynecology

## 2023-08-11 ENCOUNTER — Ambulatory Visit: Payer: Managed Care, Other (non HMO) | Admitting: Obstetrics and Gynecology

## 2023-08-11 ENCOUNTER — Encounter: Payer: Self-pay | Admitting: Obstetrics and Gynecology

## 2023-08-11 VITALS — BP 122/80 | HR 65 | Ht 62.0 in | Wt 161.0 lb

## 2023-08-11 DIAGNOSIS — N950 Postmenopausal bleeding: Secondary | ICD-10-CM | POA: Diagnosis not present

## 2023-08-11 DIAGNOSIS — R9389 Abnormal findings on diagnostic imaging of other specified body structures: Secondary | ICD-10-CM | POA: Diagnosis not present

## 2023-08-11 NOTE — Telephone Encounter (Signed)
Spoke w/ the radiology reception desk and they stated that they will change the status of the report so that it can be read right away.

## 2023-08-11 NOTE — Progress Notes (Unsigned)
GYNECOLOGY  VISIT   HPI: 52 y.o.   Married  Caucasian  female   G2P1011 with Patient's last menstrual period was 07/26/2022 (approximate).   here for   discuss U/S done for postmenopausal bleeding.   Bled from 8/28 - 8/31.  The bleeding was like a menstrual period.  Some cramping.    FSH 31.2 07/25/23.   States she feels nervous with procedures and would like a medication to help her relax.   GYNECOLOGIC HISTORY: Patient's last menstrual period was 07/26/2022 (approximate). Contraception:  perimenopause/ vasectomy Menopausal hormone therapy:  n/a Last mammogram:  Diagnostic MMG in R breast 01/23/23 BI-RADS CAT 4 sus, 01/03/23 Breast Density Category C, BI-RADS CATEGORY 0 incomplete.  Last pap smear:   01/22/23 neg: HR HPV neg, 12/26/17 neg: HR HPV neg, 09-09-14 Neg:neg HR HPV                OB History     Gravida  2   Para  1   Term  1   Preterm      AB  1   Living  1      SAB      IAB      Ectopic      Multiple      Live Births                 Patient Active Problem List   Diagnosis Date Noted   Vaginal dryness 05/24/2022   Encounter for screening colonoscopy 05/24/2022   Depression, major, single episode, moderate ([facility]) 10/03/2018   Allergic rhinitis 03/15/2013    Past Medical History:  Diagnosis Date   Abnormal Pap smear of cervix    hx of cryotherapy to cervix age 24   Degenerative arthritis of knee, bilateral    Hypertriglyceridemia     Past Surgical History:  Procedure Laterality Date   ABDOMINOPLASTY  06/25/2021   BREAST BIOPSY Right 02/03/2023   MM RT BREAST BX W LOC DEV 1ST LESION IMAGE BX SPEC STEREO GUIDE 02/03/2023 [facility]   BREAST CYST ASPIRATION Left    cystoclel repair  2009   UMBILICAL HERNIA REPAIR  06/25/2021    Current Outpatient Medications  Medication Sig Dispense Refill   Calcium-Magnesium-Vitamin D (CALCIUM 1200+D3 PO) Take by mouth.     estradiol (ESTRACE) 0.1 MG/GM vaginal cream 1 gram three times per week  at hs or as directed by provider 42.5 g 2   estradiol (VIVELLE-DOT) 0.075 MG/24HR Place 1 patch onto the skin 2 (two) times a week. 24 patch 3   loratadine (HM LORATADINE) 10 MG tablet TAKE ONE TABLET (10MG  TOTAL) BY MOUTH DAILY 90 tablet 1   Multiple Vitamin (MULTIVITAMIN) tablet Take 1 tablet by mouth daily.     Multiple Vitamins-Minerals (ONE DAILY CALCIUM/IRON) TABS Take 1 tablet by mouth daily.     Omega-3 Fatty Acids (FISH OIL PO) Take by mouth.     progesterone (PROMETRIUM) 100 MG capsule Take 1 capsule (100 mg total) by mouth daily. 90 capsule 3   No current facility-administered medications for this visit.     ALLERGIES: Patient has no known allergies.  Family History  Problem Relation Age of Onset   Hypertension Mother    Arthritis Mother    Anxiety disorder Mother    Heart disease Father    Colitis Father    Dementia Father    Breast cancer Paternal Aunt    Cancer Maternal Grandmother        Ovarian  Social History   Socioeconomic History   Marital status: Married    Spouse name: Not on file   Number of children: Not on file   Years of education: Not on file   Highest education level: Not on file  Occupational History   Not on file  Tobacco Use   Smoking status: Never   Smokeless tobacco: Never  Vaping Use   Vaping status: Never Used  Substance and Sexual Activity   Alcohol use: Not Currently   Drug use: No   Sexual activity: Yes    Partners: Male    Comment: Husband had vasectomy   Other Topics Concern   Not on file  Social History Narrative   Not on file   Social Determinants of Health   Financial Resource Strain: Not on file  Food Insecurity: Not on file  Transportation Needs: Not on file  Physical Activity: Not on file  Stress: Not on file  Social Connections: Not on file  Intimate Partner Violence: Not on file    Review of Systems  All other systems reviewed and are negative.   PHYSICAL EXAMINATION:    BP 122/80 (BP Location:  Right Arm, Patient Position: Sitting, Cuff Size: Normal)   Pulse 65   Ht 5\' 2"  (1.575 m)   Wt 161 lb (73 kg)   LMP 07/26/2022 (Approximate)   SpO2 98%   BMI 29.45 kg/m     General appearance: alert, cooperative and appears stated age  Study Result  Narrative & Impression  CLINICAL DATA:  Postmenopausal bleeding.   EXAM: TRANSABDOMINAL AND TRANSVAGINAL ULTRASOUND OF PELVIS   TECHNIQUE: Both transabdominal and transvaginal ultrasound examinations of the pelvis were performed. Transabdominal technique was performed for global imaging of the pelvis including uterus, ovaries, adnexal regions, and pelvic cul-de-sac. It was necessary to proceed with endovaginal exam following the transabdominal exam to visualize the endometrium and ovaries.   COMPARISON:  None Available.   FINDINGS: Uterus   Measurements: 7.4 x 3.6 x 4.8 cm = volume: 66 mL. No fibroids or other mass visualized.   Endometrium   Thickness: 10 mm.  No focal abnormality visualized.   Right ovary   Measurements: 2.1 x 1.8 x 2.3 cm = volume: 4.8 mL. Normal appearance/no adnexal mass.   Left ovary   Measurements: 1.3 x 1.1 x 1.7 cm = volume: 1.3 mL. Normal appearance/no adnexal mass.   Other findings   No abnormal free fluid.   IMPRESSION: Endometrial thickness measures 10 mm. In the setting of post-menopausal bleeding, endometrial sampling is indicated to exclude carcinoma.   No uterine fibroids identified.   Normal appearance of both ovaries.     Electronically Signed   By: Danae Orleans M.D.   On: 08/11/2023 12:31      ASSESSMENT  Postmenopausal bleeding on HRT.  Thickened endometrium.    PLAN  We discussed her pelvic US.  I recommended endometrial biopsy to evaluate further.  Procedure explained.  Biopsy can identify polyp, infection, precancer and cancer.  We discussed potential treatments for polyp, precancer and cancer.  Patient requests medication for anxiety for procedure.    Xanax 0.25 mg one hour prior to procedure. #2, RF none.  She signed her consent form today. She will have a driver to and from her procedure.  Ok to continue her HRT for now.   30 min  total time was spent for this patient encounter, including preparation, face-to-face counseling with the patient, coordination of care, and documentation of the encounter.

## 2023-08-11 NOTE — Telephone Encounter (Signed)
Please obtain result for pelvic ultrasound done for my patient on 08/04/23.   She has a consultation at 12:00, and the result is not available yet.   Thank you.

## 2023-08-12 MED ORDER — ALPRAZOLAM 0.25 MG PO TABS: ORAL_TABLET | ORAL | 0 refills | Status: DC

## 2023-08-12 NOTE — Patient Instructions (Signed)
Endometrial Biopsy  An endometrial biopsy is a procedure where a tissue sample is removed from the lining of the uterus. This lining is called the endometrium. The tissue sample is then sent to a lab for testing. You may have this type of biopsy to check for: Cancer. Infection. Growths called polyps. Uterine bleeding that can't be explained. Tell a health care provider about: Any allergies you have. All medicines you're taking including vitamins, herbs, eye drops, creams, and over-the-counter medicines. Any problems you or family members have had with anesthesia. Any bleeding problems you have. Any surgeries you have had. Any medical problems you have. Whether you're pregnant or may be pregnant. What are the risks? Your health care provider will talk with you about risks. These may include: Bleeding. Infection. Allergic reactions to medicines. Damage to the wall of the uterus. This is rare. What happens before the procedure? Keep track of your period. You may need to have this biopsy when you're not having your period. Ask your provider about: Changing or stopping your regular medicines. These include any diabetes medicines or blood thinners you take. Taking medicines such as aspirin and ibuprofen. These medicines can thin your blood. Do not take them unless your provider tells you to. Taking over-the-counter medicines, vitamins, herbs, and supplements. Bring a pad with you. You may need to wear one after the biopsy. Plan to have a responsible adult take you home from the hospital or clinic. You won't be allowed to drive. What happens during the procedure? A tool will be put into your vagina to hold it open. This helps your provider see the cervix. The cervix is the lowest part of the uterus. Your cervix will be cleaned with a solution that kills germs. You will be given anesthesia. This keeps you from feeling pain. It will numb your cervix. A tool called forceps will be used to  hold your cervix steady. A thin tool called a uterine sound will be put through your cervix. It will be used to: Find the length of your uterus. Find where to take the sample from. A soft tube called a catheter will be put into your uterus. The catheter will remove a tissue sample. The tube and tools will be removed. The sample will be sent to a lab for testing. The procedure may vary among providers and hospitals. What happens after the procedure? Your blood pressure, heart rate, breathing rate, and blood oxygen level will be monitored until you leave the hospital or clinic. It's up to you to get the results of your procedure. Ask your provider, or the department that is doing the procedure, when your results will be ready. This information is not intended to replace advice given to you by your health care provider. Make sure you discuss any questions you have with your health care provider. Document Revised: 01/21/2023 Document Reviewed: 01/21/2023 Elsevier Patient Education  2024 [employer].

## 2023-08-12 NOTE — Telephone Encounter (Signed)
[place] report came through yesterday and was discussed with pt at her OV per BS result notes. Encounter closed.

## 2023-09-02 NOTE — Progress Notes (Signed)
GYNECOLOGY  VISIT   HPI: 52 y.o.   Married  Caucasian  female   G2P1011 with Patient's last menstrual period was 07/26/2022 (approximate).   here for   endometrial  bx for postmenopausal bleeding. Pt has had light bleeding in the last month-described to be very short, like a light period.  Patient is on HRT.  Vivelle Dot 0.075 mg twice weekly and Prometrium 100 mg q hs.   FSH 31.2 on 07/25/23.  FSH 52.6 and estradiol 60 on 01/22/23.   She took 1.5 Xanax tablets.   Study Result  Narrative & Impression  CLINICAL DATA:  Postmenopausal bleeding.   EXAM: TRANSABDOMINAL AND TRANSVAGINAL ULTRASOUND OF PELVIS   TECHNIQUE: Both transabdominal and transvaginal ultrasound examinations of the pelvis were performed. Transabdominal technique was performed for global imaging of the pelvis including uterus, ovaries, adnexal regions, and pelvic cul-de-sac. It was necessary to proceed with endovaginal exam following the transabdominal exam to visualize the endometrium and ovaries.   COMPARISON:  None Available.   FINDINGS: Uterus   Measurements: 7.4 x 3.6 x 4.8 cm = volume: 66 mL. No fibroids or other mass visualized.   Endometrium   Thickness: 10 mm.  No focal abnormality visualized.   Right ovary   Measurements: 2.1 x 1.8 x 2.3 cm = volume: 4.8 mL. Normal appearance/no adnexal mass.   Left ovary   Measurements: 1.3 x 1.1 x 1.7 cm = volume: 1.3 mL. Normal appearance/no adnexal mass.   Other findings   No abnormal free fluid.   IMPRESSION: Endometrial thickness measures 10 mm. In the setting of post-menopausal bleeding, endometrial sampling is indicated to exclude carcinoma.   No uterine fibroids identified.   Normal appearance of both ovaries.     Electronically Signed   By: Danae Orleans M.D.   On: 08/11/2023 12:31    GYNECOLOGIC HISTORY: Patient's last menstrual period was 07/26/2022 (approximate). Contraception:  PMP Menopausal hormone therapy:  n/a Last  mammogram:   Diagnostic MMG in R breast 01/23/23 BI-RADS CAT 4 sus, 01/03/23 Breast Density Category C, BI-RADS CATEGORY 0 incomplete.  Last pap smear:   01/22/23 neg: HR HPV neg, 12/26/17 neg: HR HPV neg, 09-09-14 Neg:neg HR HPV         OB History     Gravida  2   Para  1   Term  1   Preterm      AB  1   Living  1      SAB      IAB      Ectopic      Multiple      Live Births                 Patient Active Problem List   Diagnosis Date Noted   Vaginal dryness 05/24/2022   Encounter for screening colonoscopy 05/24/2022   Depression, major, single episode, moderate (HCC) 10/03/2018   Allergic rhinitis 03/15/2013    Past Medical History:  Diagnosis Date   Abnormal Pap smear of cervix    hx of cryotherapy to cervix age 44   Degenerative arthritis of knee, bilateral    Hypertriglyceridemia     Past Surgical History:  Procedure Laterality Date   ABDOMINOPLASTY  06/25/2021   BREAST BIOPSY Right 02/03/2023   MM RT BREAST BX W LOC DEV 1ST LESION IMAGE BX SPEC STEREO GUIDE 02/03/2023 [facility]   BREAST CYST ASPIRATION Left    cystoclel repair  2009   UMBILICAL HERNIA REPAIR  06/25/2021  Current Outpatient Medications  Medication Sig Dispense Refill   ALPRAZolam (XANAX) 0.25 MG tablet Take one tablet (0.25 mg) by mouth one hour prior to procedure. 2 tablet 0   Calcium-Magnesium-Vitamin D (CALCIUM 1200+D3 PO) Take by mouth.     estradiol (ESTRACE) 0.1 MG/GM vaginal cream 1 gram three times per week at hs or as directed by provider 42.5 g 2   estradiol (VIVELLE-DOT) 0.075 MG/24HR Place 1 patch onto the skin 2 (two) times a week. 24 patch 3   loratadine (HM LORATADINE) 10 MG tablet TAKE ONE TABLET (10MG  TOTAL) BY MOUTH DAILY 90 tablet 1   Multiple Vitamin (MULTIVITAMIN) tablet Take 1 tablet by mouth daily.     Multiple Vitamins-Minerals (ONE DAILY CALCIUM/IRON) TABS Take 1 tablet by mouth daily.     Omega-3 Fatty Acids (FISH OIL PO) Take by mouth.      progesterone (PROMETRIUM) 100 MG capsule Take 1 capsule (100 mg total) by mouth daily. 90 capsule 3   No current facility-administered medications for this visit.     ALLERGIES: Patient has no known allergies.  Family History  Problem Relation Age of Onset   Hypertension Mother    Arthritis Mother    Anxiety disorder Mother    Heart disease Father    Colitis Father    Dementia Father    Breast cancer Paternal Aunt    Cancer Maternal Grandmother        Ovarian    Social History   Socioeconomic History   Marital status: Married    Spouse name: Not on file   Number of children: Not on file   Years of education: Not on file   Highest education level: Not on file  Occupational History   Not on file  Tobacco Use   Smoking status: Never   Smokeless tobacco: Never  Vaping Use   Vaping status: Never Used  Substance and Sexual Activity   Alcohol use: Not Currently   Drug use: No   Sexual activity: Yes    Partners: Male    Comment: Husband had vasectomy   Other Topics Concern   Not on file  Social History Narrative   Not on file   Social Determinants of Health   Financial Resource Strain: Not on file  Food Insecurity: Not on file  Transportation Needs: Not on file  Physical Activity: Not on file  Stress: Not on file  Social Connections: Not on file  Intimate Partner Violence: Not on file    Review of Systems  All other systems reviewed and are negative.   PHYSICAL EXAMINATION:    BP 120/76 (BP Location: Right Arm, Patient Position: Sitting, Cuff Size: Normal)   Ht 5\' 2"  (1.575 m)   Wt 162 lb (73.5 kg)   LMP 07/26/2022 (Approximate)   BMI 29.63 kg/m     General appearance: alert, cooperative and appears stated age   EMB: Consent for procedure. Sterile prep with   Paracervical block with 1% lidocaine 10 cc, lot [id number], expiration July 2026  Tenaculum to anterior cervical lip. Pipelle passed to      7    cm twice.   Tissue to pathology.  Minimal  EBL. No complications.   Warren Lacy, CMA present.   ASSESSMENT  Postmenopausal bleeding on HRT.    PLAN  FU EMB.  Post biopsy precautions given.  Ok to continue HRT.  May need to increase to Prometrium 200 mg q hs.  Final plan to follow.

## 2023-09-03 ENCOUNTER — Encounter: Payer: Self-pay | Admitting: Obstetrics and Gynecology

## 2023-09-04 NOTE — Telephone Encounter (Signed)
Renee Cordova -Do you have the procedure consent? Patient is scheduled for 09/16/23.

## 2023-09-05 NOTE — Telephone Encounter (Signed)
Routing FYI.   Encounter closed.

## 2023-09-16 ENCOUNTER — Encounter: Payer: Self-pay | Admitting: Obstetrics and Gynecology

## 2023-09-16 ENCOUNTER — Other Ambulatory Visit (HOSPITAL_COMMUNITY)
Admission: RE | Admit: 2023-09-16 | Discharge: 2023-09-16 | Disposition: A | Discharge status: Home / Self Care | Payer: Managed Care, Other (non HMO) | Source: Ambulatory Visit | Attending: Obstetrics and Gynecology | Admitting: Obstetrics and Gynecology

## 2023-09-16 ENCOUNTER — Ambulatory Visit: Payer: Managed Care, Other (non HMO) | Admitting: Obstetrics and Gynecology

## 2023-09-16 VITALS — BP 120/76 | Ht 62.0 in | Wt 162.0 lb

## 2023-09-16 DIAGNOSIS — Z79890 Hormone replacement therapy (postmenopausal): Secondary | ICD-10-CM

## 2023-09-16 DIAGNOSIS — N950 Postmenopausal bleeding: Secondary | ICD-10-CM | POA: Insufficient documentation

## 2023-09-16 NOTE — Patient Instructions (Signed)
 Endometrial Biopsy  An endometrial biopsy is a procedure where a tissue sample is removed from the lining of the uterus. This lining is called the endometrium. The tissue sample is then sent to a lab for testing. You may have this type of biopsy to check for: Cancer. Infection. Growths called polyps. Uterine bleeding that can't be explained. Tell a health care provider about: Any allergies you have. All medicines you're taking including vitamins, herbs, eye drops, creams, and over-the-counter medicines. Any problems you or family members have had with anesthesia. Any bleeding problems you have. Any surgeries you have had. Any medical problems you have. Whether you're pregnant or may be pregnant. What are the risks? Your health care provider will talk with you about risks. These may include: Bleeding. Infection. Allergic reactions to medicines. Damage to the wall of the uterus. This is rare. What happens before the procedure? Keep track of your period. You may need to have this biopsy when you're not having your period. Ask your provider about: Changing or stopping your regular medicines. These include any diabetes medicines or blood thinners you take. Taking medicines such as aspirin and ibuprofen. These medicines can thin your blood. Do not take them unless your provider tells you to. Taking over-the-counter medicines, vitamins, herbs, and supplements. Bring a pad with you. You may need to wear one after the biopsy. Plan to have a responsible adult take you home from the hospital or clinic. You won't be allowed to drive. What happens during the procedure? A tool will be put into your vagina to hold it open. This helps your provider see the cervix. The cervix is the lowest part of the uterus. Your cervix will be cleaned with a solution that kills germs. You will be given anesthesia. This keeps you from feeling pain. It will numb your cervix. A tool called forceps will be used to  hold your cervix steady. A thin tool called a uterine sound will be put through your cervix. It will be used to: Find the length of your uterus. Find where to take the sample from. A soft tube called a catheter will be put into your uterus. The catheter will remove a tissue sample. The tube and tools will be removed. The sample will be sent to a lab for testing. The procedure may vary among providers and hospitals. What happens after the procedure? Your blood pressure, heart rate, breathing rate, and blood oxygen level will be monitored until you leave the hospital or clinic. It's up to you to get the results of your procedure. Ask your provider, or the department that is doing the procedure, when your results will be ready. This information is not intended to replace advice given to you by your health care provider. Make sure you discuss any questions you have with your health care provider. Document Revised: 01/21/2023 Document Reviewed: 01/21/2023 Elsevier Patient Education  2024 [employer].

## 2023-09-18 LAB — SURGICAL PATHOLOGY

## 2023-09-22 ENCOUNTER — Encounter: Payer: Self-pay | Admitting: Obstetrics and Gynecology

## 2023-09-22 MED ORDER — PROGESTERONE 200 MG PO CAPS: 200.0000 mg | ORAL_CAPSULE | Freq: Every day | ORAL | 1 refills | Status: DC

## 2023-09-22 NOTE — Telephone Encounter (Signed)
09/16/23 results reviewed.  Rx pended for Prometrium 200 mg caps PO daily. #90/2RF

## 2024-01-13 NOTE — Progress Notes (Unsigned)
 53 y.o. G39P1011 Married Caucasian female here for annual exam.    No night sweats, sleeping through the night, no cramping or bleeding.  She wants to continue her current HRT.  Using vaginal estradiol cream occasionally.   Notes a pimple in her groin area for the last 3 - 4 days and itching on her left hip and under right breast.  Asking about referral to dermatology. Family sees Dr. Margo Aye.   PCP: Tommie Sams, DO   Patient's last menstrual period was 07/26/2022 (approximate).           Sexually active: Yes.    The current method of family planning is vasectomy.    Menopausal hormone therapy:  estrace, vivelle-dot, progesterone Exercising: Yes.     walking Smoker:  no  OB History  Gravida Para Term Preterm AB Living  2 1 1  1 1   SAB IAB Ectopic Multiple Live Births          # Outcome Date GA Lbr Len/2nd Weight Sex Type Anes PTL Lv  2 Term           1 AB              HEALTH MAINTENANCE: Last 2 paps:  01/22/23 neg: HR HPV neg, 12/26/17 neg: HR HPV neg  History of abnormal Pap or positive HPV:  no Mammogram:  Diagnostic MMG in R breast 01/23/23 BI-RADS CAT 4 sus, 01/03/23 Breast Density Category C, BI-RADS CATEGORY 0 incomplete. Bx right breast 02/03/23;  fibrocystic changes and calcifications.   Colonoscopy:  within the last two years per pt.  Done in [place], now at [facility] location.   Bone Density:  n/a  Result  n/a   Immunization History  Administered Date(s) Administered   Influenza,Quad,Nasal, Live 10/28/2013   Influenza,inj,Quad PF,6+ Mos 09/02/2014, 10/06/2015, 10/11/2016, 12/05/2017, 08/25/2018, 08/14/2020   Influenza-Unspecified 12/05/2017, 08/26/2019   Moderna Sars-Covid-2 Vaccination 01/21/2020, 02/18/2020   Tdap 09/09/2014      reports that she has never smoked. She has never used smokeless tobacco. She reports that she does not currently use alcohol. She reports that she does not use drugs.  Past Medical History:  Diagnosis Date   Abnormal Pap smear of cervix     hx of cryotherapy to cervix age 13   Degenerative arthritis of knee, bilateral    Hypertriglyceridemia     Past Surgical History:  Procedure Laterality Date   ABDOMINOPLASTY  06/25/2021   BREAST BIOPSY Right 02/03/2023   MM RT BREAST BX W LOC DEV 1ST LESION IMAGE BX SPEC STEREO GUIDE 02/03/2023 [facility]   BREAST CYST ASPIRATION Left    cystoclel repair  2009   UMBILICAL HERNIA REPAIR  06/25/2021    Current Outpatient Medications  Medication Sig Dispense Refill   Calcium-Magnesium-Vitamin D (CALCIUM 1200+D3 PO) Take by mouth.     estradiol (ESTRACE) 0.1 MG/GM vaginal cream 1 gram three times per week at hs or as directed by provider 42.5 g 2   estradiol (VIVELLE-DOT) 0.075 MG/24HR Place 1 patch onto the skin 2 (two) times a week. 24 patch 3   loratadine (HM LORATADINE) 10 MG tablet TAKE ONE TABLET (10MG  TOTAL) BY MOUTH DAILY 90 tablet 1   Multiple Vitamin (MULTIVITAMIN) tablet Take 1 tablet by mouth daily.     Multiple Vitamins-Minerals (ONE DAILY CALCIUM/IRON) TABS Take 1 tablet by mouth daily.     Omega-3 Fatty Acids (FISH OIL PO) Take by mouth.     progesterone (PROMETRIUM) 200 MG capsule  Take 1 capsule (200 mg total) by mouth daily. 90 capsule 1   No current facility-administered medications for this visit.    ALLERGIES: Patient has no known allergies.  Family History  Problem Relation Age of Onset   Hypertension Mother    Arthritis Mother    Anxiety disorder Mother    Heart disease Father    Colitis Father    Dementia Father    Breast cancer Paternal Aunt    Cancer Maternal Grandmother        Ovarian    Review of Systems  All other systems reviewed and are negative.   PHYSICAL EXAM:  BP 130/84 (BP Location: Right Arm, Patient Position: Sitting, Cuff Size: Small)   Pulse 76   Ht 5\' 2"  (1.575 m)   Wt 164 lb (74.4 kg)   LMP 07/26/2022 (Approximate)   SpO2 97%   BMI 30.00 kg/m     General appearance: alert, cooperative and appears stated age Head:  normocephalic, without obvious abnormality, atraumatic Neck: no adenopathy, supple, symmetrical, trachea midline and thyroid normal to inspection and palpation Lungs: clear to auscultation bilaterally Breasts: normal appearance, no masses or tenderness, No nipple retraction or dimpling, No nipple discharge or bleeding, No axillary adenopathy Heart: regular rate and rhythm Abdomen: soft, non-tender; no masses, no organomegaly Extremities: extremities normal, atraumatic, no cyanosis or edema Skin: skin color, texture, turgor normal. No rashes or lesions Lymph nodes: cervical, supraclavicular, and axillary nodes normal. Neurologic: grossly normal  Pelvic: External genitalia:  no lesions.  7 - 8 mm cyst of the left inguinal area with mild erythema, nontender.               No abnormal inguinal nodes palpated.              Urethra:  normal appearing urethra with no masses, tenderness or lesions              Bartholins and Skenes: normal                 Vagina: normal appearing vagina with normal color and discharge, no lesions              Cervix: no lesions              Pap taken: no Bimanual Exam:  Uterus:  normal size, contour, position, consistency, mobility, non-tender              Adnexa: no mass, fullness, tenderness              Rectal exam: yes.  Confirms.              Anus:  normal sphincter tone, no lesions  Chaperone was present for exam:  Warren Lacy, CMA  ASSESSMENT: Well woman visit with gynecologic exam. HRT.  Vaginal dryness.  Hx cystocele repair.  PHQ-9: 0 Inflamed sebaceous cyst likely.   PLAN: Mammogram screening discussed. Self breast awareness reviewed. Pap and HRV collected:  no.  Due in 2029. Guidelines for Calcium, Vitamin D, regular exercise program including cardiovascular and weight bearing exercise. Medication refills:  Vivelle Dot 0.075 mg twice weekly and Prometrium 200 mg q hs, estradiol cream.  Discused WHI and use of HRT which can increase risk of PE,  DVT, MI, stroke and breast cancer.  Benefits of decreased risk of osteoporosis and colon cancer also reviewed.  She wishes to continue her hormone treatment.  Warm compress to inflamed cyst. She will establish care with the family dermatologist  and contact me if she needs a referral. Follow up:  yearly and prn.

## 2024-01-27 ENCOUNTER — Other Ambulatory Visit: Payer: Self-pay | Admitting: Family Medicine

## 2024-01-27 ENCOUNTER — Encounter: Payer: Self-pay | Admitting: Obstetrics and Gynecology

## 2024-01-27 ENCOUNTER — Ambulatory Visit (INDEPENDENT_AMBULATORY_CARE_PROVIDER_SITE_OTHER): Payer: Managed Care, Other (non HMO) | Admitting: Obstetrics and Gynecology

## 2024-01-27 VITALS — BP 130/84 | HR 76 | Ht 62.0 in | Wt 164.0 lb

## 2024-01-27 DIAGNOSIS — Z1231 Encounter for screening mammogram for malignant neoplasm of breast: Secondary | ICD-10-CM

## 2024-01-27 DIAGNOSIS — N898 Other specified noninflammatory disorders of vagina: Secondary | ICD-10-CM

## 2024-01-27 DIAGNOSIS — Z01419 Encounter for gynecological examination (general) (routine) without abnormal findings: Secondary | ICD-10-CM

## 2024-01-27 DIAGNOSIS — Z1331 Encounter for screening for depression: Secondary | ICD-10-CM

## 2024-01-27 DIAGNOSIS — Z79890 Hormone replacement therapy (postmenopausal): Secondary | ICD-10-CM

## 2024-01-27 MED ORDER — ESTRADIOL 0.1 MG/GM VA CREA
TOPICAL_CREAM | VAGINAL | 2 refills | Status: DC
Start: 2024-01-27 — End: 2024-01-29

## 2024-01-27 MED ORDER — PROGESTERONE 200 MG PO CAPS: 200.0000 mg | ORAL_CAPSULE | Freq: Every day | ORAL | 3 refills | Status: AC

## 2024-01-27 MED ORDER — ESTRADIOL 0.075 MG/24HR TD PTTW: 1.0000 | MEDICATED_PATCH | TRANSDERMAL | 3 refills | Status: AC

## 2024-01-27 NOTE — Patient Instructions (Signed)

## 2024-01-29 ENCOUNTER — Other Ambulatory Visit: Payer: Self-pay

## 2024-01-29 DIAGNOSIS — N898 Other specified noninflammatory disorders of vagina: Secondary | ICD-10-CM

## 2024-01-29 MED ORDER — ESTRADIOL 0.1 MG/GM VA CREA: TOPICAL_CREAM | VAGINAL | 1 refills | Status: AC

## 2024-01-29 NOTE — Telephone Encounter (Signed)
 Spoke to [place] in regards to PA for this pt. Stated PA was not needed for estradiol cream. Pharmacy requested 99 day supply, which is not covered by insurance. Medication dosage needs to be changed to 90 day supply for the pharmacy to process. Routing to provider for prescription duration change.

## 2024-02-17 ENCOUNTER — Ambulatory Visit: Payer: Self-pay

## 2024-09-27 ENCOUNTER — Ambulatory Visit: Admitting: Family Medicine

## 2024-09-27 VITALS — BP 135/85 | HR 75 | Temp 98.4°F | Ht 62.0 in | Wt 165.4 lb

## 2024-09-27 DIAGNOSIS — H1011 Acute atopic conjunctivitis, right eye: Secondary | ICD-10-CM | POA: Diagnosis not present

## 2024-09-27 DIAGNOSIS — H1010 Acute atopic conjunctivitis, unspecified eye: Secondary | ICD-10-CM | POA: Insufficient documentation

## 2024-09-27 MED ORDER — AZELASTINE HCL 0.05 % OP SOLN: 1.0000 [drp] | Freq: Two times a day (BID) | OPHTHALMIC | 12 refills | Status: AC

## 2024-09-27 NOTE — Progress Notes (Signed)
 Subjective:  Patient ID: Renee Cordova, female    DOB: 1971/09/09  Age: 53 y.o. MRN: [id number]  CC:   Chief Complaint  Patient presents with   Eye Pain    Patient is here for eye irritation.  Patient said its only the right inner corner of the eye. It's an itch/watery feeling.     HPI:  53 year old female presents for evaluation of the above.  2-week history of symptoms.  Reports right eye itching, watering.  She states that she often wants to scratch the medial aspect of her eye.  No relieving factors.  She is taking her daily antihistamine.  No other associated symptoms.  No other complaints.  Patient Active Problem List   Diagnosis Date Noted   Allergic conjunctivitis 09/27/2024   Vaginal dryness 05/24/2022   Encounter for screening colonoscopy 05/24/2022   Depression, major, single episode, moderate (HCC) 10/03/2018   Allergic rhinitis 03/15/2013    Social Hx   Social History   Socioeconomic History   Marital status: Married    Spouse name: Not on file   Number of children: Not on file   Years of education: Not on file   Highest education level: Not on file  Occupational History   Not on file  Tobacco Use   Smoking status: Never   Smokeless tobacco: Never  Vaping Use   Vaping status: Never Used  Substance and Sexual Activity   Alcohol use: Not Currently   Drug use: No   Sexual activity: Yes    Partners: Male    Comment: Husband had vasectomy   Other Topics Concern   Not on file  Social History Narrative   Not on file   Social Drivers of Health   Financial Resource Strain: Not on file  Food Insecurity: Not on file  Transportation Needs: Not on file  Physical Activity: Not on file  Stress: Not on file  Social Connections: Not on file    Review of Systems Per HPI  Objective:  BP 135/85 (BP Location: Left Arm, Patient Position: Sitting)   Pulse 75   Temp 98.4 F (36.9 C)   Ht 5' 2 (1.575 m)   Wt 165 lb 6 oz (75 kg)   LMP 07/26/2022  (Approximate)   BMI 30.25 kg/m      09/27/2024   11:30 AM 01/27/2024    9:33 AM 09/16/2023    9:52 AM  BP/Weight  Systolic BP 135 130 120  Diastolic BP 85 84 76  Wt. (Lbs) 165.38 164 162  BMI 30.25 kg/m2 30 kg/m2 29.63 kg/m2    Physical Exam Vitals and nursing note reviewed.  Constitutional:      Appearance: Normal appearance.  HENT:     Head: Normocephalic and atraumatic.  Eyes:     General:        Right eye: No discharge.        Left eye: No discharge.     Conjunctiva/sclera: Conjunctivae normal.  Pulmonary:     Effort: Pulmonary effort is normal. No respiratory distress.  Neurological:     Mental Status: She is alert.  Psychiatric:        Mood and Affect: Mood normal.        Behavior: Behavior normal.     Lab Results  Component Value Date   WBC 6.3 03/02/2021   HGB 12.8 03/02/2021   HCT 38.1 03/02/2021   PLT 300 03/02/2021   GLUCOSE 90 03/02/2021   CHOL 201 (H) 03/02/2021  TRIG 201 (H) 03/02/2021   HDL 51 03/02/2021   LDLCALC 115 (H) 03/02/2021   ALT 25 03/02/2021   AST 22 03/02/2021   NA 143 03/02/2021   K 5.4 (H) 03/02/2021   CL 104 03/02/2021   CREATININE 0.87 03/02/2021   BUN 15 03/02/2021   CO2 23 03/02/2021   TSH 3.300 04/21/2018     Assessment & Plan:  Allergic conjunctivitis of right eye Assessment & Plan: Treating with azelastine.  As needed Systane.  Orders: -     Azelastine HCl; Place 1 drop into the right eye 2 (two) times daily.  Dispense: 6 mL; Refill: 12    Follow-up:  Return if symptoms worsen or fail to improve.  Jacqulyn Ahle DO [facility]

## 2024-09-27 NOTE — Patient Instructions (Addendum)
 Systane eye drops as needed (Ultra).  Medication as directed.  Take care  Dr. Bluford

## 2024-09-27 NOTE — Assessment & Plan Note (Signed)
 Treating with azelastine.  As needed Systane.

## 2025-02-01 ENCOUNTER — Ambulatory Visit: Admitting: Obstetrics and Gynecology
